# Patient Record
Sex: Female | Born: 1995 | Race: White | Hispanic: No | Marital: Single | Smoking: Never smoker
Health system: Southern US, Community
[De-identification: ages and names within clinical notes are randomized; demographics above are authoritative.]

---

## 2016-08-10 ENCOUNTER — Encounter: Payer: Self-pay | Admitting: Physician Assistant

## 2016-09-24 ENCOUNTER — Ambulatory Visit (INDEPENDENT_AMBULATORY_CARE_PROVIDER_SITE_OTHER): Payer: BLUE CROSS/BLUE SHIELD | Admitting: Physician Assistant

## 2016-09-24 ENCOUNTER — Encounter: Payer: Self-pay | Admitting: Physician Assistant

## 2016-09-24 VITALS — BP 120/77 | HR 84 | Temp 97.2°F | Ht 62.0 in | Wt 137.2 lb

## 2016-09-24 DIAGNOSIS — Z0000 Encounter for general adult medical examination without abnormal findings: Secondary | ICD-10-CM

## 2016-09-24 MED ORDER — DESOGESTREL-ETHINYL ESTRADIOL 0.15-0.02/0.01 MG (21/5) PO TABS: 1.0000 | ORAL_TABLET | Freq: Every day | ORAL | 11 refills | Status: DC

## 2016-09-24 NOTE — Progress Notes (Signed)
BP 120/77   Pulse 84   Temp 97.2 F (36.2 C) (Oral)   Ht 5' 2"  (1.575 m)   Wt 137 lb 3.2 oz (62.2 kg)   LMP 09/17/2016   BMI 25.09 kg/m    Subjective:    Patient ID: Sheryl Baxter, female    DOB: Nov 04, 1995, 21 y.o.   MRN: [id number]  HPI: Sheryl Baxter is a 21 y.o. female presenting on 09/24/2016 for Annual Exam  This patient comes in for annual well physical examination. All medications are reviewed today. There are no reports of any problems with the medications. All of the medical conditions are reviewed and updated.  Lab work is reviewed and will be ordered as medically necessary. There are no new problems reported with today's visit.  Patient reports doing well overall. It is her first female exam and somewhat nervous.  History reviewed. No pertinent past medical history. Relevant past medical, surgical, family and social history reviewed and updated as indicated. Interim medical history since our last visit reviewed. Allergies and medications reviewed and updated. DATA REVIEWED: CHART IN EPIC  Social History   Social History  . Marital status: Single    Spouse name: N/A  . Number of children: N/A  . Years of education: N/A   Occupational History  . Not on file.   Social History Main Topics  . Smoking status: Never Smoker  . Smokeless tobacco: Never Used  . Alcohol use No  . Drug use: No  . Sexual activity: Not Currently   Other Topics Concern  . Not on file   Social History Narrative  . No narrative on file    History reviewed. No pertinent surgical history.  Family History  Problem Relation Age of Onset  . GER disease Mother   . Heart murmur Mother   . GER disease Father   . Sleep apnea Father   . Depression Maternal Grandmother   . Bipolar disorder Maternal Grandmother   . Osteoporosis Maternal Grandmother   . GER disease Maternal Grandmother   . Heart disease Maternal Grandfather   . Cancer Paternal Grandmother     Esophagus cancer  .  Heart disease Paternal Grandfather     Review of Systems  Constitutional: Negative.  Negative for activity change, fatigue and fever.  HENT: Negative.   Eyes: Negative.   Respiratory: Negative.  Negative for cough.   Cardiovascular: Negative.  Negative for chest pain.  Gastrointestinal: Negative.  Negative for abdominal pain.  Endocrine: Negative.   Genitourinary: Negative.  Negative for dysuria.  Musculoskeletal: Negative.   Skin: Negative.   Neurological: Negative.     Allergies as of 09/24/2016      Reactions   Zyrtec [cetirizine]       Medication List       Accurate as of 09/24/16 10:13 AM. Always use your most recent med list.          desogestrel-ethinyl estradiol 0.15-0.02/0.01 MG (21/5) tablet Commonly known as:  KARIVA,AZURETTE,MIRCETTE Take 1 tablet by mouth daily.          Objective:    BP 120/77   Pulse 84   Temp 97.2 F (36.2 C) (Oral)   Ht 5' 2"  (1.575 m)   Wt 137 lb 3.2 oz (62.2 kg)   LMP 09/17/2016   BMI 25.09 kg/m   Allergies  Allergen Reactions  . Zyrtec [Cetirizine]     Wt Readings from Last 3 Encounters:  09/24/16 137 lb 3.2 oz (62.2 kg)  Physical Exam  Constitutional: She is oriented to person, place, and time. She appears well-developed and well-nourished.  HENT:  Head: Normocephalic and atraumatic.  Eyes: Conjunctivae and EOM are normal. Pupils are equal, round, and reactive to light.  Neck: Normal range of motion. Neck supple.  Cardiovascular: Normal rate, regular rhythm, normal heart sounds and intact distal pulses.   Pulmonary/Chest: Effort normal and breath sounds normal. Right breast exhibits no mass, no skin change and no tenderness. Left breast exhibits no mass, no skin change and no tenderness. Breasts are symmetrical.  Abdominal: Soft. Bowel sounds are normal.  Genitourinary: Vagina normal and uterus normal. Rectal exam shows no fissure. No breast swelling, tenderness, discharge or bleeding. There is no tenderness or  lesion on the right labia. There is no tenderness or lesion on the left labia. Uterus is not deviated, not enlarged and not tender. Cervix exhibits no motion tenderness, no discharge and no friability. Right adnexum displays no mass, no tenderness and no fullness. Left adnexum displays no mass, no tenderness and no fullness. No tenderness or bleeding in the vagina. No vaginal discharge found.  Neurological: She is alert and oriented to person, place, and time. She has normal reflexes.  Skin: Skin is warm and dry. No rash noted.  Psychiatric: She has a normal mood and affect. Her behavior is normal. Judgment and thought content normal.    No results found for this or any previous visit.    Assessment & Plan:   1. Well adult exam - desogestrel-ethinyl estradiol (KARIVA,AZURETTE,MIRCETTE) 0.15-0.02/0.01 MG (21/5) tablet; Take 1 tablet by mouth daily.  Dispense: 1 Package; Refill: 11   Continue all other maintenance medications as listed above.  Follow up plan: Return in about 1 year (around 09/24/2017) for recheck.  Educational handout given for health maintenance  Terald Sleeper PA-C [facility] [address]  [place], [place] [address] [phone]   09/24/2016, 10:13 AM

## 2016-09-24 NOTE — Addendum Note (Signed)
Addended by: Thana Ates on: 09/24/2016 10:20 AM   Modules accepted: Orders

## 2016-09-24 NOTE — Patient Instructions (Signed)
Health Maintenance, Female Adopting a healthy lifestyle and getting preventive care can go a long way to promote health and wellness. Talk with your health care provider about what schedule of regular examinations is right for you. This is a good chance for you to check in with your provider about disease prevention and staying healthy. In between checkups, there are plenty of things you can do on your own. Experts have done a lot of research about which lifestyle changes and preventive measures are most likely to keep you healthy. Ask your health care provider for more information. Weight and diet Eat a healthy diet  Be sure to include plenty of vegetables, fruits, low-fat dairy products, and lean protein.  Do not eat a lot of foods high in solid fats, added sugars, or salt.  Get regular exercise. This is one of the most important things you can do for your health.  Most adults should exercise for at least 150 minutes each week. The exercise should increase your heart rate and make you sweat (moderate-intensity exercise).  Most adults should also do strengthening exercises at least twice a week. This is in addition to the moderate-intensity exercise. Maintain a healthy weight  Body mass index (BMI) is a measurement that can be used to identify possible weight problems. It estimates body fat based on height and weight. Your health care provider can help determine your BMI and help you achieve or maintain a healthy weight.  For females 76 years of age and older:  A BMI below 18.5 is considered underweight.  A BMI of 18.5 to 24.9 is normal.  A BMI of 25 to 29.9 is considered overweight.  A BMI of 30 and above is considered obese. Watch levels of cholesterol and blood lipids  You should start having your blood tested for lipids and cholesterol at 21 years of age, then have this test every 5 years.  You may need to have your cholesterol levels checked more often if:  Your lipid or  cholesterol levels are high.  You are older than 21 years of age.  You are at high risk for heart disease. Cancer screening Lung Cancer  Lung cancer screening is recommended for adults 64-42 years old who are at high risk for lung cancer because of a history of smoking.  A yearly low-dose CT scan of the lungs is recommended for people who:  Currently smoke.  Have quit within the past 15 years.  Have at least a 30-pack-year history of smoking. A pack year is smoking an average of one pack of cigarettes a day for 1 year.  Yearly screening should continue until it has been 15 years since you quit.  Yearly screening should stop if you develop a health problem that would prevent you from having lung cancer treatment. Breast Cancer  Practice breast self-awareness. This means understanding how your breasts normally appear and feel.  It also means doing regular breast self-exams. Let your health care provider know about any changes, no matter how small.  If you are in your 20s or 30s, you should have a clinical breast exam (CBE) by a health care provider every 1-3 years as part of a regular health exam.  If you are 34 or older, have a CBE every year. Also consider having a breast X-ray (mammogram) every year.  If you have a family history of breast cancer, talk to your health care provider about genetic screening.  If you are at high risk for breast cancer, talk  to your health care provider about having an MRI and a mammogram every year.  Breast cancer gene (BRCA) assessment is recommended for women who have family members with BRCA-related cancers. BRCA-related cancers include:  Breast.  Ovarian.  Tubal.  Peritoneal cancers.  Results of the assessment will determine the need for genetic counseling and BRCA1 and BRCA2 testing. Cervical Cancer  Your health care provider may recommend that you be screened regularly for cancer of the pelvic organs (ovaries, uterus, and vagina).  This screening involves a pelvic examination, including checking for microscopic changes to the surface of your cervix (Pap test). You may be encouraged to have this screening done every 3 years, beginning at age 24.  For women ages 66-65, health care providers may recommend pelvic exams and Pap testing every 3 years, or they may recommend the Pap and pelvic exam, combined with testing for human papilloma virus (HPV), every 5 years. Some types of HPV increase your risk of cervical cancer. Testing for HPV may also be done on women of any age with unclear Pap test results.  Other health care providers may not recommend any screening for nonpregnant women who are considered low risk for pelvic cancer and who do not have symptoms. Ask your health care provider if a screening pelvic exam is right for you.  If you have had past treatment for cervical cancer or a condition that could lead to cancer, you need Pap tests and screening for cancer for at least 20 years after your treatment. If Pap tests have been discontinued, your risk factors (such as having a new sexual partner) need to be reassessed to determine if screening should resume. Some women have medical problems that increase the chance of getting cervical cancer. In these cases, your health care provider may recommend more frequent screening and Pap tests. Colorectal Cancer  This type of cancer can be detected and often prevented.  Routine colorectal cancer screening usually begins at 21 years of age and continues through 21 years of age.  Your health care provider may recommend screening at an earlier age if you have risk factors for colon cancer.  Your health care provider may also recommend using home test kits to check for hidden blood in the stool.  A small camera at the end of a tube can be used to examine your colon directly (sigmoidoscopy or colonoscopy). This is done to check for the earliest forms of colorectal cancer.  Routine  screening usually begins at age 41.  Direct examination of the colon should be repeated every 5-10 years through 21 years of age. However, you may need to be screened more often if early forms of precancerous polyps or small growths are found. Skin Cancer  Check your skin from head to toe regularly.  Tell your health care provider about any new moles or changes in moles, especially if there is a change in a mole's shape or color.  Also tell your health care provider if you have a mole that is larger than the size of a pencil eraser.  Always use sunscreen. Apply sunscreen liberally and repeatedly throughout the day.  Protect yourself by wearing long sleeves, pants, a wide-brimmed hat, and sunglasses whenever you are outside. Heart disease, diabetes, and high blood pressure  High blood pressure causes heart disease and increases the risk of stroke. High blood pressure is more likely to develop in:  People who have blood pressure in the high end of the normal range (130-139/85-89 mm Hg).  People who are overweight or obese.  People who are African American.  If you are 59-24 years of age, have your blood pressure checked every 3-5 years. If you are 34 years of age or older, have your blood pressure checked every year. You should have your blood pressure measured twice-once when you are at a hospital or clinic, and once when you are not at a hospital or clinic. Record the average of the two measurements. To check your blood pressure when you are not at a hospital or clinic, you can use:  An automated blood pressure machine at a pharmacy.  A home blood pressure monitor.  If you are between 29 years and 60 years old, ask your health care provider if you should take aspirin to prevent strokes.  Have regular diabetes screenings. This involves taking a blood sample to check your fasting blood sugar level.  If you are at a normal weight and have a low risk for diabetes, have this test once  every three years after 21 years of age.  If you are overweight and have a high risk for diabetes, consider being tested at a younger age or more often. Preventing infection Hepatitis B  If you have a higher risk for hepatitis B, you should be screened for this virus. You are considered at high risk for hepatitis B if:  You were born in a country where hepatitis B is common. Ask your health care provider which countries are considered high risk.  Your parents were born in a high-risk country, and you have not been immunized against hepatitis B (hepatitis B vaccine).  You have HIV or AIDS.  You use needles to inject street drugs.  You live with someone who has hepatitis B.  You have had sex with someone who has hepatitis B.  You get hemodialysis treatment.  You take certain medicines for conditions, including cancer, organ transplantation, and autoimmune conditions. Hepatitis C  Blood testing is recommended for:  Everyone born from [birth year] through 1965.  Anyone with known risk factors for hepatitis C. Sexually transmitted infections (STIs)  You should be screened for sexually transmitted infections (STIs) including gonorrhea and chlamydia if:  You are sexually active and are younger than 21 years of age.  You are older than 21 years of age and your health care provider tells you that you are at risk for this type of infection.  Your sexual activity has changed since you were last screened and you are at an increased risk for chlamydia or gonorrhea. Ask your health care provider if you are at risk.  If you do not have HIV, but are at risk, it may be recommended that you take a prescription medicine daily to prevent HIV infection. This is called pre-exposure prophylaxis (PrEP). You are considered at risk if:  You are sexually active and do not regularly use condoms or know the HIV status of your partner(s).  You take drugs by injection.  You are sexually active with a partner  who has HIV. Talk with your health care provider about whether you are at high risk of being infected with HIV. If you choose to begin PrEP, you should first be tested for HIV. You should then be tested every 3 months for as long as you are taking PrEP. Pregnancy  If you are premenopausal and you may become pregnant, ask your health care provider about preconception counseling.  If you may become pregnant, take 400 to 800 micrograms (mcg) of folic acid  every day.  If you want to prevent pregnancy, talk to your health care provider about birth control (contraception). Osteoporosis and menopause  Osteoporosis is a disease in which the bones lose minerals and strength with aging. This can result in serious bone fractures. Your risk for osteoporosis can be identified using a bone density scan.  If you are 4 years of age or older, or if you are at risk for osteoporosis and fractures, ask your health care provider if you should be screened.  Ask your health care provider whether you should take a calcium or vitamin D supplement to lower your risk for osteoporosis.  Menopause may have certain physical symptoms and risks.  Hormone replacement therapy may reduce some of these symptoms and risks. Talk to your health care provider about whether hormone replacement therapy is right for you. Follow these instructions at home:  Schedule regular health, dental, and eye exams.  Stay current with your immunizations.  Do not use any tobacco products including cigarettes, chewing tobacco, or electronic cigarettes.  If you are pregnant, do not drink alcohol.  If you are breastfeeding, limit how much and how often you drink alcohol.  Limit alcohol intake to no more than 1 drink per day for nonpregnant women. One drink equals 12 ounces of beer, 5 ounces of wine, or 1 ounces of hard liquor.  Do not use street drugs.  Do not share needles.  Ask your health care provider for help if you need support  or information about quitting drugs.  Tell your health care provider if you often feel depressed.  Tell your health care provider if you have ever been abused or do not feel safe at home. This information is not intended to replace advice given to you by your health care provider. Make sure you discuss any questions you have with your health care provider. Document Released: 11/23/2010 Document Revised: 10/16/2015 Document Reviewed: 02/11/2015 Elsevier Interactive Patient Education  2017 [employer].

## 2016-09-29 LAB — PAP IG, CT-NG, RFX HPV ASCU
Chlamydia, Nuc. Acid Amp: NEGATIVE
GONOCOCCUS BY NUCLEIC ACID AMP: NEGATIVE
PAP Smear Comment: 0

## 2016-10-06 ENCOUNTER — Ambulatory Visit (INDEPENDENT_AMBULATORY_CARE_PROVIDER_SITE_OTHER): Payer: BLUE CROSS/BLUE SHIELD | Admitting: Family Medicine

## 2016-10-06 ENCOUNTER — Ambulatory Visit (INDEPENDENT_AMBULATORY_CARE_PROVIDER_SITE_OTHER): Payer: BLUE CROSS/BLUE SHIELD

## 2016-10-06 ENCOUNTER — Encounter: Payer: Self-pay | Admitting: Family Medicine

## 2016-10-06 VITALS — BP 112/73 | HR 86 | Temp 97.6°F | Ht 62.0 in | Wt 140.0 lb

## 2016-10-06 DIAGNOSIS — M25561 Pain in right knee: Secondary | ICD-10-CM | POA: Diagnosis not present

## 2016-10-06 MED ORDER — METHYLPREDNISOLONE ACETATE 80 MG/ML IJ SUSP
80.0000 mg | Freq: Once | INTRAMUSCULAR | Status: AC
  Administered 2016-10-06: 80 mg via INTRAMUSCULAR

## 2016-10-06 NOTE — Progress Notes (Signed)
BP 112/73   Pulse 86   Temp 97.6 F (36.4 C) (Oral)   Ht 5' 2"  (1.575 m)   Wt 140 lb (63.5 kg)   LMP 09/17/2016   BMI 25.61 kg/m    Subjective:    Patient ID: Sheryl Baxter, female    DOB: August 14, 1995, 21 y.o.   MRN: [id number]  HPI: Sheryl Baxter is a 21 y.o. female presenting on 10/06/2016 for Knot on right knee and Knee Pain (right knee, painful when bending and if she is on feet a lot)   HPI Right knee pain Patient has been having right knee pain for the past week. She cannot recall any specific incident or trauma that brought it on but she is at her work place where she is up and down ladders and on concrete floors a lot. She started developing the swelling and pain in her right knee and took some Tylenol and thought that it would get better but has not get better over the past weeks that she is coming in here today to get it evaluated. She denies any giving way of the knee but does have some popping. She denies any locking or catching. She denies any fevers or chills or redness or warmth.  Relevant past medical, surgical, family and social history reviewed and updated as indicated. Interim medical history since our last visit reviewed. Allergies and medications reviewed and updated.  Review of Systems  Constitutional: Negative for chills and fever.  Respiratory: Negative for chest tightness and shortness of breath.   Cardiovascular: Negative for chest pain and leg swelling.  Musculoskeletal: Positive for arthralgias and joint swelling. Negative for back pain and gait problem.  Skin: Negative for color change and rash.  Neurological: Negative for light-headedness and headaches.  Psychiatric/Behavioral: Negative for agitation and behavioral problems.  All other systems reviewed and are negative.   Per HPI unless specifically indicated above        Objective:    BP 112/73   Pulse 86   Temp 97.6 F (36.4 C) (Oral)   Ht 5' 2"  (1.575 m)   Wt 140 lb (63.5 kg)    LMP 09/17/2016   BMI 25.61 kg/m   Wt Readings from Last 3 Encounters:  10/06/16 140 lb (63.5 kg)  09/24/16 137 lb 3.2 oz (62.2 kg)    Physical Exam  Constitutional: She is oriented to person, place, and time. She appears well-developed and well-nourished. No distress.  Eyes: Conjunctivae are normal.  Musculoskeletal: Normal range of motion. She exhibits tenderness. She exhibits no edema.       Right knee: She exhibits effusion and abnormal meniscus. She exhibits normal range of motion, no erythema, normal alignment, no LCL laxity, normal patellar mobility, no bony tenderness and no MCL laxity. Tenderness found. Lateral joint line tenderness noted.  Neurological: She is alert and oriented to person, place, and time. Coordination normal.  Skin: Skin is warm and dry. No rash noted. She is not diaphoretic.  Psychiatric: She has a normal mood and affect. Her behavior is normal.  Nursing note and vitals reviewed.  Right knee x-ray: No acute bony abnormalities  Knee injection: Risk factors of bleeding and infection discussed with patient and patient is agreeable towards injection. Patient prepped with Betadine. Lateral approach towards injection used. Injected 80 mg of Depo-Medrol and 1 mL of 2% lidocaine. Patient tolerated procedure well and no side effects from noted. Minimal to no bleeding. Simple bandage applied after.     Assessment & Plan:  Problem List Items Addressed This Visit    None    Visit Diagnoses    Acute pain of right knee    -  Primary   Relevant Medications   methylPREDNISolone acetate (DEPO-MEDROL) injection 80 mg (Start on 10/06/2016  2:45 PM)   Other Relevant Orders   DG Knee 1-2 Views Right (Completed)      Return as needed if worsens or does not improve, likely suspicious for meniscal injury  Follow up plan: Return if symptoms worsen or fail to improve.  Counseling provided for all of the vaccine components Orders Placed This Encounter  Procedures  . DG Knee  1-2 Views Right    Caryl Pina, MD [facility] 10/06/2016, 1:51 PM

## 2017-02-14 ENCOUNTER — Other Ambulatory Visit: Payer: Self-pay | Admitting: Physician Assistant

## 2017-02-14 DIAGNOSIS — Z0000 Encounter for general adult medical examination without abnormal findings: Secondary | ICD-10-CM

## 2017-02-14 MED ORDER — DESOGESTREL-ETHINYL ESTRADIOL 0.15-0.02/0.01 MG (21/5) PO TABS: 1.0000 | ORAL_TABLET | Freq: Every day | ORAL | 3 refills | Status: DC

## 2017-02-14 NOTE — Telephone Encounter (Signed)
Rx sent to [facility]

## 2017-03-30 ENCOUNTER — Ambulatory Visit (INDEPENDENT_AMBULATORY_CARE_PROVIDER_SITE_OTHER): Payer: 59 | Admitting: Physician Assistant

## 2017-03-30 ENCOUNTER — Encounter: Payer: Self-pay | Admitting: Physician Assistant

## 2017-03-30 VITALS — BP 117/72 | HR 79 | Temp 98.4°F | Ht 62.0 in | Wt 142.0 lb

## 2017-03-30 DIAGNOSIS — N390 Urinary tract infection, site not specified: Secondary | ICD-10-CM

## 2017-03-30 DIAGNOSIS — R300 Dysuria: Secondary | ICD-10-CM | POA: Diagnosis not present

## 2017-03-30 DIAGNOSIS — N3000 Acute cystitis without hematuria: Secondary | ICD-10-CM | POA: Diagnosis not present

## 2017-03-30 HISTORY — DX: Urinary tract infection, site not specified: N39.0

## 2017-03-30 LAB — URINALYSIS, COMPLETE
BILIRUBIN UA: NEGATIVE
Glucose, UA: NEGATIVE
KETONES UA: NEGATIVE
LEUKOCYTES UA: NEGATIVE
Nitrite, UA: NEGATIVE
PH UA: 5.5 (ref 5.0–7.5)
Protein, UA: NEGATIVE
SPEC GRAV UA: 1.02 (ref 1.005–1.030)
UUROB: 0.2 mg/dL (ref 0.2–1.0)

## 2017-03-30 LAB — MICROSCOPIC EXAMINATION
EPITHELIAL CELLS (NON RENAL): NONE SEEN /HPF (ref 0–10)
RENAL EPITHEL UA: NONE SEEN /HPF

## 2017-03-30 MED ORDER — SULFAMETHOXAZOLE-TRIMETHOPRIM 400-80 MG PO TABS: 1.0000 | ORAL_TABLET | Freq: Two times a day (BID) | ORAL | 0 refills | Status: DC

## 2017-03-30 NOTE — Progress Notes (Signed)
BP 117/72   Pulse 79   Temp 98.4 F (36.9 C) (Oral)   Ht 5' 2"  (1.575 m)   Wt 142 lb (64.4 kg)   BMI 25.97 kg/m    Subjective:    Patient ID: Sheryl Baxter, female    DOB: [birth year], 21 y.o.   MRN: [id number]  HPI: Sheryl Baxter is a 21 y.o. female presenting on 03/30/2017 for Back Pain and Urinary Frequency  This patient has had several days of dysuria, frequency and nocturia. There is also pain over the bladder in the suprapubic region, no back pain. Denies leakage or hematuria.  Denies fever or chills. No pain in flank area.  Relevant past medical, surgical, family and social history reviewed and updated as indicated. Allergies and medications reviewed and updated.  History reviewed. No pertinent past medical history.  History reviewed. No pertinent surgical history.  Review of Systems  Constitutional: Negative.   HENT: Negative.   Eyes: Negative.   Respiratory: Negative.   Gastrointestinal: Negative.   Genitourinary: Positive for difficulty urinating, dysuria and urgency. Negative for flank pain.    Allergies as of 03/30/2017      Reactions   Zyrtec [cetirizine]       Medication List        Accurate as of 03/30/17  2:44 PM. Always use your most recent med list.          desogestrel-ethinyl estradiol 0.15-0.02/0.01 MG (21/5) tablet Commonly known as:  KARIVA,AZURETTE,MIRCETTE Take 1 tablet by mouth daily.   sulfamethoxazole-trimethoprim 400-80 MG tablet Commonly known as:  BACTRIM,SEPTRA Take 1 tablet 2 (two) times daily by mouth.          Objective:    BP 117/72   Pulse 79   Temp 98.4 F (36.9 C) (Oral)   Ht 5' 2"  (1.575 m)   Wt 142 lb (64.4 kg)   BMI 25.97 kg/m   Allergies  Allergen Reactions  . Zyrtec [Cetirizine]     Physical Exam  Constitutional: She is oriented to person, place, and time. She appears well-developed and well-nourished.  HENT:  Head: Normocephalic and atraumatic.  Eyes: Conjunctivae are normal. Pupils are equal,  round, and reactive to light.  Cardiovascular: Normal rate, regular rhythm, normal heart sounds and intact distal pulses.  Pulmonary/Chest: Effort normal and breath sounds normal.  Abdominal: Soft. Bowel sounds are normal. She exhibits no distension and no mass. There is tenderness in the suprapubic area. There is no rebound, no guarding and no CVA tenderness.  Neurological: She is alert and oriented to person, place, and time. She has normal reflexes.  Skin: Skin is warm and dry. No rash noted.  Psychiatric: She has a normal mood and affect. Her behavior is normal. Judgment and thought content normal.  Nursing note and vitals reviewed.       Assessment & Plan:   1. Dysuria - Urine Culture - Urinalysis, Complete  2. Acute cystitis without hematuria - sulfamethoxazole-trimethoprim (BACTRIM,SEPTRA) 400-80 MG tablet; Take 1 tablet 2 (two) times daily by mouth.  Dispense: 14 tablet; Refill: 0    Current Outpatient Medications:  .  desogestrel-ethinyl estradiol (KARIVA,AZURETTE,MIRCETTE) 0.15-0.02/0.01 MG (21/5) tablet, Take 1 tablet by mouth daily., Disp: 3 Package, Rfl: 3 .  sulfamethoxazole-trimethoprim (BACTRIM,SEPTRA) 400-80 MG tablet, Take 1 tablet 2 (two) times daily by mouth., Disp: 14 tablet, Rfl: 0 Continue all other maintenance medications as listed above.  Follow up plan: Follow-up as needed or worsening of symptoms. Call office for any issues.   Educational  handout given for [place] PA-C [facility] [address]  [place], [place] [address] [phone]   03/30/2017, 2:44 PM

## 2017-03-30 NOTE — Patient Instructions (Signed)
In a few days you may receive a survey in the mail or online from [employer] regarding your visit with us today. Please take a moment to fill this out. Your feedback is very important to our whole office. It can help us better understand your needs as well as improve your experience and satisfaction. Thank you for taking your time to complete it. We care about you.  Kennet Mccort, PA-C  

## 2017-03-31 LAB — URINE CULTURE

## 2017-10-04 ENCOUNTER — Ambulatory Visit (INDEPENDENT_AMBULATORY_CARE_PROVIDER_SITE_OTHER): Payer: 59 | Admitting: Family

## 2017-10-04 ENCOUNTER — Encounter: Payer: Self-pay | Admitting: Family

## 2017-10-04 VITALS — BP 124/75 | HR 80 | Temp 98.8°F | Ht 62.0 in | Wt 144.0 lb

## 2017-10-04 DIAGNOSIS — G44209 Tension-type headache, unspecified, not intractable: Secondary | ICD-10-CM

## 2017-10-04 DIAGNOSIS — Z113 Encounter for screening for infections with a predominantly sexual mode of transmission: Secondary | ICD-10-CM

## 2017-10-04 DIAGNOSIS — J301 Allergic rhinitis due to pollen: Secondary | ICD-10-CM

## 2017-10-04 DIAGNOSIS — R1031 Right lower quadrant pain: Secondary | ICD-10-CM

## 2017-10-04 LAB — URINALYSIS, COMPLETE
BILIRUBIN UA: NEGATIVE
GLUCOSE, UA: NEGATIVE
Ketones, UA: NEGATIVE
Leukocytes, UA: NEGATIVE
Nitrite, UA: NEGATIVE
PROTEIN UA: NEGATIVE
RBC UA: NEGATIVE
Specific Gravity, UA: 1.02 (ref 1.005–1.030)
UUROB: 0.2 mg/dL (ref 0.2–1.0)
pH, UA: 7.5 (ref 5.0–7.5)

## 2017-10-04 LAB — MICROSCOPIC EXAMINATION
Bacteria, UA: NONE SEEN
RBC, UA: NONE SEEN /hpf (ref 0–2)
RENAL EPITHEL UA: NONE SEEN /HPF

## 2017-10-04 MED ORDER — LORATADINE 10 MG PO TABS: 10.0000 mg | ORAL_TABLET | Freq: Every day | ORAL | 11 refills | Status: DC

## 2017-10-04 MED ORDER — FLUTICASONE PROPIONATE 50 MCG/ACT NA SUSP
2.0000 | Freq: Every day | NASAL | 6 refills | Status: DC
Start: 2017-10-04 — End: 2018-06-17

## 2017-10-04 NOTE — Progress Notes (Signed)
Subjective:    Patient ID: Sheryl Baxter, female    DOB: 05-16-1996, 22 y.o.   MRN: [id number]  Chief Complaint  Patient presents with  . Abdominal Pain    for three days with headaches    Abdominal Pain  This is a new problem. The current episode started in the past 7 days. The onset quality is gradual. The problem occurs intermittently. The problem has been unchanged. The pain is located in the generalized abdominal region. The pain is at a severity of 5/10. The pain is mild. The abdominal pain does not radiate. Associated symptoms include headaches and nausea. Pertinent negatives include no belching, constipation, diarrhea, dysuria, fever, hematochezia, hematuria or vomiting. The pain is aggravated by being still. The pain is relieved by movement. She has tried nothing for the symptoms. The treatment provided no relief.   PT requesting STD testing today. States she has had one sexual partner over the last year. Last menstrual cycle was two weeks ago.     Review of Systems  Constitutional: Negative for fever.  Gastrointestinal: Positive for abdominal pain and nausea. Negative for constipation, diarrhea, hematochezia and vomiting.  Genitourinary: Negative for dysuria and hematuria.  Neurological: Positive for headaches.  All other systems reviewed and are negative.      Objective:   Physical Exam  Constitutional: She is oriented to person, place, and time. She appears well-developed and well-nourished. No distress.  HENT:  Head: Normocephalic and atraumatic.  Right Ear: External ear normal.  Mouth/Throat: Oropharynx is clear and moist.  Eyes: Pupils are equal, round, and reactive to light.  Neck: Normal range of motion. Neck supple. No thyromegaly present.  Cardiovascular: Normal rate, regular rhythm, normal heart sounds and intact distal pulses.  No murmur heard. Pulmonary/Chest: Effort normal and breath sounds normal. No respiratory distress. She has no wheezes.    Abdominal: Soft. Bowel sounds are normal. She exhibits no distension. There is tenderness (mild) in the right lower quadrant.  Musculoskeletal: Normal range of motion. She exhibits no edema or tenderness.  Neurological: She is alert and oriented to person, place, and time. She has normal reflexes. No cranial nerve deficit.  Skin: Skin is warm and dry.  Psychiatric: She has a normal mood and affect. Her behavior is normal. Judgment and thought content normal.  Vitals reviewed.    BP 124/75   Pulse 80   Temp 98.8 F (37.1 C) (Oral)   Ht 5' 2"  (1.575 m)   Wt 144 lb (65.3 kg)   BMI 26.34 kg/m      Assessment & Plan:  Sheryl Baxter comes in today with chief complaint of Abdominal Pain (for three days with headaches)   Diagnosis and orders addressed:  1. Right lower quadrant abdominal pain -If pain does not improve call office. - Urinalysis, Complete - CBC with Differential/Platelet - Chlamydia/Gonococcus/Trichomonas, NAA - STD Screen (8) - Beta hCG quant (ref lab)  2. Acute non intractable tension-type headache I believe this is related to sinus issues, start claritin and flonase - loratadine (CLARITIN) 10 MG tablet; Take 1 tablet (10 mg total) by mouth daily.  Dispense: 30 tablet; Refill: 11 - fluticasone (FLONASE) 50 MCG/ACT nasal spray; Place 2 sprays into both nostrils daily.  Dispense: 16 g; Refill: 6  3. Screening for STDs (sexually transmitted diseases) - Chlamydia/Gonococcus/Trichomonas, NAA - STD Screen (8)  4. Allergic rhinitis due to pollen, unspecified seasonality - loratadine (CLARITIN) 10 MG tablet; Take 1 tablet (10 mg total) by mouth daily.  Dispense:  30 tablet; Refill: 11 - fluticasone (FLONASE) 50 MCG/ACT nasal spray; Place 2 sprays into both nostrils daily.  Dispense: 16 g; Refill: [place], FNP

## 2017-10-04 NOTE — Patient Instructions (Signed)

## 2017-10-05 LAB — CHLAMYDIA/GONOCOCCUS/TRICHOMONAS, NAA
CHLAMYDIA BY NAA: NEGATIVE
Gonococcus by NAA: NEGATIVE
Trich vag by NAA: NEGATIVE

## 2017-10-05 LAB — STD SCREEN (8)
HEP A IGM: NEGATIVE
HEP B S AG: NEGATIVE
HIV Screen 4th Generation wRfx: NONREACTIVE
HSV 1 Glycoprotein G Ab, IgG: <0.91 index (ref 0.00–0.90)
HSV 2 IgG, Type Spec: <0.91 index (ref 0.00–0.90)
Hep B C IgM: NEGATIVE
Hep C Virus Ab: <0.1 s/co ratio (ref 0.0–0.9)
RPR: NONREACTIVE

## 2017-10-05 LAB — CBC WITH DIFFERENTIAL/PLATELET
BASOS ABS: 0 10*3/uL (ref 0.0–0.2)
BASOS: 0 %
EOS (ABSOLUTE): 0.1 10*3/uL (ref 0.0–0.4)
Eos: 1 %
HEMOGLOBIN: 14.3 g/dL (ref 11.1–15.9)
Hematocrit: 42.6 % (ref 34.0–46.6)
IMMATURE GRANS (ABS): 0 10*3/uL (ref 0.0–0.1)
Immature Granulocytes: 0 %
LYMPHS: 32 %
Lymphocytes Absolute: 2.4 10*3/uL (ref 0.7–3.1)
MCH: 29.1 pg (ref 26.6–33.0)
MCHC: 33.6 g/dL (ref 31.5–35.7)
MCV: 87 fL (ref 79–97)
MONOCYTES: 8 %
Monocytes Absolute: 0.6 10*3/uL (ref 0.1–0.9)
Neutrophils Absolute: 4.4 10*3/uL (ref 1.4–7.0)
Neutrophils: 59 %
Platelets: 281 10*3/uL (ref 150–379)
RBC: 4.92 x10E6/uL (ref 3.77–5.28)
RDW: 13.5 % (ref 12.3–15.4)
WBC: 7.4 10*3/uL (ref 3.4–10.8)

## 2017-10-05 LAB — BETA HCG QUANT (REF LAB): hCG Quant: <1 m[IU]/mL

## 2017-11-30 ENCOUNTER — Encounter: Payer: Self-pay | Admitting: Physician Assistant

## 2017-11-30 ENCOUNTER — Ambulatory Visit (INDEPENDENT_AMBULATORY_CARE_PROVIDER_SITE_OTHER): Payer: Worker's Compensation | Admitting: Physician Assistant

## 2017-11-30 VITALS — BP 113/69 | HR 72 | Temp 97.1°F | Ht 62.0 in | Wt 150.8 lb

## 2017-11-30 DIAGNOSIS — S39012A Strain of muscle, fascia and tendon of lower back, initial encounter: Secondary | ICD-10-CM | POA: Diagnosis not present

## 2017-11-30 DIAGNOSIS — S39012D Strain of muscle, fascia and tendon of lower back, subsequent encounter: Secondary | ICD-10-CM

## 2017-11-30 MED ORDER — PREDNISONE 10 MG (21) PO TBPK: ORAL_TABLET | ORAL | 0 refills | Status: DC

## 2017-11-30 MED ORDER — CYCLOBENZAPRINE HCL 10 MG PO TABS: 10.0000 mg | ORAL_TABLET | Freq: Three times a day (TID) | ORAL | 0 refills | Status: DC | PRN

## 2017-11-30 NOTE — Patient Instructions (Signed)

## 2017-12-02 ENCOUNTER — Other Ambulatory Visit: Payer: Self-pay | Admitting: Physician Assistant

## 2017-12-02 ENCOUNTER — Telehealth: Payer: Self-pay | Admitting: Physician Assistant

## 2017-12-02 MED ORDER — DICLOFENAC POTASSIUM 50 MG PO TABS: 50.0000 mg | ORAL_TABLET | Freq: Two times a day (BID) | ORAL | 0 refills | Status: DC

## 2017-12-02 NOTE — Progress Notes (Signed)
BP 113/69   Pulse 72   Temp (!) 97.1 F (36.2 C) (Oral)   Ht 5' 2"  (1.575 m)   Wt 150 lb 12.8 oz (68.4 kg)   BMI 27.58 kg/m    Subjective:    Patient ID: Sheryl Baxter, female    DOB: [birth year], 22 y.o.   MRN: [id number]  HPI: Sheryl Baxter is a 22 y.o. female presenting on 11/30/2017 for Back Pain (x 2.5 weeks )   This patient comes in for back pain is been going on for 2 and half weeks.  It is in the lower lumbar area along the middle.  She has no radiation of pain to the gluteal area or down her legs.  After she has been lifting things at work it is much worse.  She works as a Scientist, water quality at [employer].  She has never had an injury to her back.  She states that it is worse it is a 5 out of 10.  She states her urine and bowel movements are normal.  She denies any fever. History reviewed. No pertinent past medical history. Relevant past medical, surgical, family and social history reviewed and updated as indicated. Interim medical history since our last visit reviewed. Allergies and medications reviewed and updated. DATA REVIEWED: CHART IN EPIC  Family History reviewed for pertinent findings.  Review of Systems  Constitutional: Negative.  Negative for activity change, fatigue and fever.  HENT: Negative.   Eyes: Negative.   Respiratory: Negative.  Negative for cough.   Cardiovascular: Negative.  Negative for chest pain.  Gastrointestinal: Negative.  Negative for abdominal pain.  Endocrine: Negative.   Genitourinary: Negative.  Negative for dysuria.  Musculoskeletal: Positive for back pain and myalgias.  Skin: Negative.   Neurological: Negative.     Allergies as of 11/30/2017      Reactions   Zyrtec [cetirizine]       Medication List        Accurate as of 11/30/17 11:59 PM. Always use your most recent med list.          cyclobenzaprine 10 MG tablet Commonly known as:  FLEXERIL Take 1 tablet (10 mg total) by mouth 3 (three) times daily as needed for muscle  spasms.   desogestrel-ethinyl estradiol 0.15-0.02/0.01 MG (21/5) tablet Commonly known as:  KARIVA,AZURETTE,MIRCETTE Take 1 tablet by mouth daily.   fluticasone 50 MCG/ACT nasal spray Commonly known as:  FLONASE Place 2 sprays into both nostrils daily.   loratadine 10 MG tablet Commonly known as:  CLARITIN Take 1 tablet (10 mg total) by mouth daily.   predniSONE 10 MG (21) Tbpk tablet Commonly known as:  STERAPRED UNI-PAK 21 TAB As directed x 6 days          Objective:    BP 113/69   Pulse 72   Temp (!) 97.1 F (36.2 C) (Oral)   Ht 5' 2"  (1.575 m)   Wt 150 lb 12.8 oz (68.4 kg)   BMI 27.58 kg/m   Allergies  Allergen Reactions  . Zyrtec [Cetirizine]     Wt Readings from Last 3 Encounters:  11/30/17 150 lb 12.8 oz (68.4 kg)  10/04/17 144 lb (65.3 kg)  03/30/17 142 lb (64.4 kg)    Physical Exam  Constitutional: She is oriented to person, place, and time. She appears well-developed and well-nourished.  HENT:  Head: Normocephalic and atraumatic.  Eyes: Pupils are equal, round, and reactive to light. Conjunctivae and EOM are normal.  Cardiovascular: Normal  rate, regular rhythm, normal heart sounds and intact distal pulses.  Pulmonary/Chest: Effort normal and breath sounds normal.  Abdominal: Soft. Bowel sounds are normal.  Musculoskeletal:       Lumbar back: She exhibits tenderness, pain and spasm.       Back:  Neurological: She is alert and oriented to person, place, and time. She has normal reflexes.  Skin: Skin is warm and dry. No rash noted.  Psychiatric: She has a normal mood and affect. Her behavior is normal. Judgment and thought content normal.        Assessment & Plan:   1. Lumbar strain, subsequent encounter Flexeril 10 mg TID for spasms No lifting over 20 pounds for 1 week Prednisone taper pack  Continue all other maintenance medications as listed above.  Follow up plan: Return in about 1 month (around 12/28/2017), or recheck.  Educational  handout given for [place] PA-C [facility] [address]  [place], [place] [address] [phone]   12/02/2017, 12:56 PM

## 2017-12-02 NOTE — Telephone Encounter (Signed)
Patient states that she is has had nausea and vomiting from the prednisone. Please advise

## 2017-12-02 NOTE — Telephone Encounter (Signed)
Stop medication, I will send another med to her pharmacy

## 2017-12-02 NOTE — Telephone Encounter (Signed)
Patient aware of recommendation.  

## 2017-12-07 ENCOUNTER — Encounter: Payer: Self-pay | Admitting: Physician Assistant

## 2017-12-07 ENCOUNTER — Ambulatory Visit (INDEPENDENT_AMBULATORY_CARE_PROVIDER_SITE_OTHER): Payer: Worker's Compensation | Admitting: Physician Assistant

## 2017-12-07 VITALS — BP 120/74 | HR 71 | Ht 62.0 in | Wt 149.2 lb

## 2017-12-07 DIAGNOSIS — S39012D Strain of muscle, fascia and tendon of lower back, subsequent encounter: Secondary | ICD-10-CM | POA: Diagnosis not present

## 2017-12-07 NOTE — Progress Notes (Signed)
BP 120/74   Pulse 71   Ht 5' 2"  (1.575 m)   Wt 149 lb 3.2 oz (67.7 kg)   BMI 27.29 kg/m    Subjective:    Patient ID: Sheryl Baxter, female    DOB: Feb 29, 1996, 22 y.o.   MRN: [id number]  HPI: Sheryl Baxter is a 22 y.o. female presenting on 12/07/2017 for worker comp (follow up-back )  This patient comes in for recheck on her Workmen's Comp. of her lumbar strain.  She has been limiting her lifting to 20 pounds or less since she was seen last.  She is using Cataflam and Flexeril for the inflammation and spasm.  This past week was the final day of the restriction and she still is having some difficulty.  She tried to lift something a little bit heavier and still felt the pain.  She is primarily sore in the upper lumbar area.  The lower lumbar area is greatly improved she reports.  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 She was given stretches to do for a back strain and she is continuing to do those daily.  I do not feel that she is going to need physical therapy at this time.  If she worsens then we may need to send her.  When I saw her on November 30, 2017 she had had the pain going on for about 2-1/2 weeks.  This would have been approximately June 23 or so.  History reviewed. No pertinent past medical history. Relevant past medical, surgical, family and social history reviewed and updated as indicated. Interim medical history since our last visit reviewed. Allergies and medications reviewed and updated. DATA REVIEWED: CHART IN EPIC  Family History reviewed for pertinent findings.  Review of Systems  Constitutional: Negative.  Negative for activity change, fatigue and fever.  HENT: Negative.   Eyes: Negative.   Respiratory: Negative.  Negative for cough.   Cardiovascular: Negative.  Negative for chest pain.  Gastrointestinal: Negative.  Negative for abdominal pain.  Endocrine: Negative.   Genitourinary: Negative.  Negative for dysuria.    Musculoskeletal: Positive for arthralgias, back pain and myalgias.  Skin: Negative.   Neurological: Negative.     Allergies as of 12/07/2017      Reactions   Zyrtec [cetirizine]       Medication List        Accurate as of 12/07/17 12:50 PM. Always use your most recent med list.          cyclobenzaprine 10 MG tablet Commonly known as:  FLEXERIL Take 1 tablet (10 mg total) by mouth 3 (three) times daily as needed for muscle spasms.   desogestrel-ethinyl estradiol 0.15-0.02/0.01 MG (21/5) tablet Commonly known as:  KARIVA,AZURETTE,MIRCETTE Take 1 tablet by mouth daily.   diclofenac 50 MG tablet Commonly known as:  CATAFLAM Take 1 tablet (50 mg total) by mouth 2 (two) times daily.   fluticasone 50 MCG/ACT nasal spray Commonly known as:  FLONASE Place 2 sprays into both nostrils daily.   loratadine 10 MG tablet Commonly known as:  CLARITIN Take 1 tablet (10 mg total) by mouth daily.          Objective:    BP 120/74   Pulse 71   Ht 5' 2"  (1.575 m)   Wt 149 lb 3.2 oz (67.7 kg)   BMI 27.29 kg/m   Allergies  Allergen Reactions  . Zyrtec [Cetirizine]     Wt Readings  from Last 3 Encounters:  12/07/17 149 lb 3.2 oz (67.7 kg)  11/30/17 150 lb 12.8 oz (68.4 kg)  10/04/17 144 lb (65.3 kg)    Physical Exam  Constitutional: She is oriented to person, place, and time. She appears well-developed and well-nourished.  HENT:  Head: Normocephalic and atraumatic.  Eyes: Pupils are equal, round, and reactive to light. Conjunctivae and EOM are normal.  Cardiovascular: Normal rate, regular rhythm, normal heart sounds and intact distal pulses.  Pulmonary/Chest: Effort normal and breath sounds normal.  Abdominal: Soft. Bowel sounds are normal.  Musculoskeletal:       Lumbar back: She exhibits tenderness, pain and spasm.       Back:  Neurological: She is alert and oriented to person, place, and time. She has normal reflexes.  Skin: Skin is warm and dry. No rash noted.   Psychiatric: She has a normal mood and affect. Her behavior is normal. Judgment and thought content normal.        Assessment & Plan:   1. Lumbar strain, subsequent encounter Continue restricted lifting through December 16, 2017. Return to clinic July 25 Continue Cataflam Continue Flexeril Continue stretches   Continue all other maintenance medications as listed above.  Follow up plan: Return in about 10 days  Educational handout given for [place] PA-C [facility] [address]  [place], [place] [address] [phone]   12/07/2017, 12:50 PM

## 2017-12-15 ENCOUNTER — Encounter: Payer: Self-pay | Admitting: Physician Assistant

## 2017-12-15 ENCOUNTER — Ambulatory Visit (INDEPENDENT_AMBULATORY_CARE_PROVIDER_SITE_OTHER): Payer: Worker's Compensation | Admitting: Physician Assistant

## 2017-12-15 VITALS — BP 120/74 | HR 81 | Temp 98.0°F | Ht 62.0 in | Wt 149.8 lb

## 2017-12-15 DIAGNOSIS — S39012D Strain of muscle, fascia and tendon of lower back, subsequent encounter: Secondary | ICD-10-CM

## 2017-12-15 NOTE — Progress Notes (Signed)
BP 120/74   Pulse 81   Temp 98 F (36.7 C) (Oral)   Ht 5' 2"  (1.575 m)   Wt 149 lb 12.8 oz (67.9 kg)   BMI 27.40 kg/m    Subjective:    Patient ID: Sheryl Baxter, female    DOB: 02/29/1996, 22 y.o.   MRN: [id number]  HPI: Sheryl Baxter is a 22 y.o. female presenting on 12/15/2017 for Back Pain (worker comp follow up )  Back for one week recheck on lumbar strain This was [employer] comp through [employer]. She has been working but lifting 20 pounds or less. There has been improvement this week, but did get up this morning and was a little tight.  History reviewed. No pertinent past medical history. Relevant past medical, surgical, family and social history reviewed and updated as indicated. Interim medical history since our last visit reviewed. Allergies and medications reviewed and updated. DATA REVIEWED: CHART IN EPIC  Family History reviewed for pertinent findings.  Review of Systems  Constitutional: Negative.   HENT: Negative.   Eyes: Negative.   Respiratory: Negative.   Gastrointestinal: Negative.   Genitourinary: Negative.   Musculoskeletal: Positive for back pain and myalgias.    Allergies as of 12/15/2017      Reactions   Zyrtec [cetirizine]       Medication List        Accurate as of 12/15/17 11:29 AM. Always use your most recent med list.          cyclobenzaprine 10 MG tablet Commonly known as:  FLEXERIL Take 1 tablet (10 mg total) by mouth 3 (three) times daily as needed for muscle spasms.   desogestrel-ethinyl estradiol 0.15-0.02/0.01 MG (21/5) tablet Commonly known as:  KARIVA,AZURETTE,MIRCETTE Take 1 tablet by mouth daily.   diclofenac 50 MG tablet Commonly known as:  CATAFLAM Take 1 tablet (50 mg total) by mouth 2 (two) times daily.   fluticasone 50 MCG/ACT nasal spray Commonly known as:  FLONASE Place 2 sprays into both nostrils daily.   loratadine 10 MG tablet Commonly known as:  CLARITIN Take 1 tablet (10 mg total) by mouth  daily.          Objective:    BP 120/74   Pulse 81   Temp 98 F (36.7 C) (Oral)   Ht 5' 2"  (1.575 m)   Wt 149 lb 12.8 oz (67.9 kg)   BMI 27.40 kg/m   Allergies  Allergen Reactions  . Zyrtec [Cetirizine]     Wt Readings from Last 3 Encounters:  12/15/17 149 lb 12.8 oz (67.9 kg)  12/07/17 149 lb 3.2 oz (67.7 kg)  11/30/17 150 lb 12.8 oz (68.4 kg)    Physical Exam  Constitutional: She is oriented to person, place, and time. She appears well-developed and well-nourished.  HENT:  Head: Normocephalic and atraumatic.  Eyes: Pupils are equal, round, and reactive to light. Conjunctivae and EOM are normal.  Cardiovascular: Normal rate, regular rhythm, normal heart sounds and intact distal pulses.  Pulmonary/Chest: Effort normal and breath sounds normal.  Abdominal: Soft. Bowel sounds are normal.  Musculoskeletal:       Lumbar back: Normal. She exhibits normal range of motion, no tenderness, no pain and no spasm.  Neurological: She is alert and oriented to person, place, and time. She has normal reflexes.  Skin: Skin is warm and dry. No rash noted.  Psychiatric: She has a normal mood and affect. Her behavior is normal. Judgment and thought content normal.  Assessment & Plan:   1. Lumbar strain, subsequent encounter Continue stretches meds as needed May return to full duty 12/26/17 ( after returning form vacation).   Continue all other maintenance medications as listed above.  Follow up plan: No follow-ups on file.  Educational handout given for [place] PA-C [facility] [address]  [place], [place] [address] [phone]   12/15/2017, 11:29 AM

## 2017-12-16 ENCOUNTER — Other Ambulatory Visit: Payer: Self-pay | Admitting: Physician Assistant

## 2017-12-16 ENCOUNTER — Telehealth: Payer: Self-pay | Admitting: Physician Assistant

## 2017-12-16 DIAGNOSIS — Z0000 Encounter for general adult medical examination without abnormal findings: Secondary | ICD-10-CM

## 2017-12-16 NOTE — Telephone Encounter (Signed)
Last PAP I see 09/24/16

## 2017-12-20 NOTE — Telephone Encounter (Signed)
Per [facility] med records mailed today

## 2018-01-02 ENCOUNTER — Ambulatory Visit: Payer: 59 | Admitting: Family

## 2018-01-02 ENCOUNTER — Encounter: Payer: Self-pay | Admitting: Family Medicine

## 2018-01-02 ENCOUNTER — Ambulatory Visit (INDEPENDENT_AMBULATORY_CARE_PROVIDER_SITE_OTHER): Payer: 59 | Admitting: Family Medicine

## 2018-01-02 VITALS — BP 121/75 | HR 77 | Temp 97.2°F | Ht 62.0 in | Wt 152.0 lb

## 2018-01-02 DIAGNOSIS — H66001 Acute suppurative otitis media without spontaneous rupture of ear drum, right ear: Secondary | ICD-10-CM | POA: Diagnosis not present

## 2018-01-02 MED ORDER — AMOXICILLIN-POT CLAVULANATE 875-125 MG PO TABS: 1.0000 | ORAL_TABLET | Freq: Two times a day (BID) | ORAL | 0 refills | Status: DC

## 2018-01-02 NOTE — Progress Notes (Signed)
Chief Complaint  Patient presents with  . Ear Pain    pt here today c/o right ear pain    HPI  Patient presents today for denied in today for pain in the right ear.  She was at the beach last week and swam a lot of the water she used swimmer's eardrops but unfortunately after getting home the pain that she recently was experiencing has returned.  This has not impacted her hearing.  She has no other upper respiratory symptoms that she is reporting.  No fever chills sweats  PMH: Smoking status noted ROS: Per HPI  Objective: BP 121/75   Pulse 77   Temp (!) 97.2 F (36.2 C) (Oral)   Ht 5' 2"  (1.575 m)   Wt 152 lb (68.9 kg)   BMI 27.80 kg/m  Gen: NAD, alert, cooperative with exam HEENT: NCAT, EOMI, PERRL the right TM has hyperemia and erythema.  There is some mattering in front of the eardrum as well that appears to be some wax mixed with dried purulent matter. CV: RRR, good S1/S2, no murmur Resp: CTABL, no wheezes, non-labored Ext: No edema, warm Neuro: Alert and oriented, No gross deficits  Assessment and plan:  1. Acute suppurative otitis media of right ear without spontaneous rupture of tympanic membrane, recurrence not specified     Meds ordered this encounter  Medications  . amoxicillin-clavulanate (AUGMENTIN) 875-125 MG tablet    Sig: Take 1 tablet by mouth 2 (two) times daily.    Dispense:  20 tablet    Refill:  0      Follow up as needed.  Claretta Fraise, MD

## 2018-01-12 ENCOUNTER — Ambulatory Visit: Payer: Self-pay | Admitting: Pediatrics

## 2018-01-20 ENCOUNTER — Other Ambulatory Visit (HOSPITAL_COMMUNITY)
Admission: RE | Admit: 2018-01-20 | Discharge: 2018-01-20 | Disposition: A | Discharge status: Home / Self Care | Payer: 59 | Source: Ambulatory Visit | Attending: Family Medicine | Admitting: Family Medicine

## 2018-01-20 ENCOUNTER — Encounter: Payer: Self-pay | Admitting: *Deleted

## 2018-01-20 ENCOUNTER — Ambulatory Visit (INDEPENDENT_AMBULATORY_CARE_PROVIDER_SITE_OTHER): Payer: 59 | Admitting: Family Medicine

## 2018-01-20 ENCOUNTER — Other Ambulatory Visit: Payer: Self-pay | Admitting: Family Medicine

## 2018-01-20 ENCOUNTER — Ambulatory Visit (INDEPENDENT_AMBULATORY_CARE_PROVIDER_SITE_OTHER): Payer: 59

## 2018-01-20 ENCOUNTER — Ambulatory Visit (HOSPITAL_COMMUNITY)
Admission: RE | Admit: 2018-01-20 | Discharge: 2018-01-20 | Disposition: A | Discharge status: Home / Self Care | Payer: 59 | Source: Ambulatory Visit | Attending: Family Medicine | Admitting: Family Medicine

## 2018-01-20 ENCOUNTER — Encounter: Payer: Self-pay | Admitting: Family Medicine

## 2018-01-20 VITALS — BP 120/76 | HR 103 | Temp 98.7°F | Ht 62.0 in | Wt 144.0 lb

## 2018-01-20 DIAGNOSIS — R1084 Generalized abdominal pain: Secondary | ICD-10-CM

## 2018-01-20 DIAGNOSIS — K529 Noninfective gastroenteritis and colitis, unspecified: Secondary | ICD-10-CM

## 2018-01-20 DIAGNOSIS — N200 Calculus of kidney: Secondary | ICD-10-CM | POA: Diagnosis not present

## 2018-01-20 LAB — URINALYSIS, COMPLETE
BILIRUBIN UA: NEGATIVE
GLUCOSE, UA: NEGATIVE
Leukocytes, UA: NEGATIVE
Nitrite, UA: NEGATIVE
UUROB: 0.2 mg/dL (ref 0.2–1.0)
pH, UA: 5.5 (ref 5.0–7.5)

## 2018-01-20 LAB — PREGNANCY, URINE: PREG TEST UR: NEGATIVE

## 2018-01-20 LAB — MICROSCOPIC EXAMINATION: Renal Epithel, UA: NONE SEEN /hpf

## 2018-01-20 MED ORDER — IOPAMIDOL (ISOVUE-300) INJECTION 61%
100.0000 mL | Freq: Once | INTRAVENOUS | Status: AC | PRN
  Administered 2018-01-20: 100 mL via INTRAVENOUS

## 2018-01-20 MED ORDER — DIPHENOXYLATE-ATROPINE 2.5-0.025 MG PO TABS: ORAL_TABLET | ORAL | 0 refills | Status: DC

## 2018-01-20 MED ORDER — ONDANSETRON HCL 8 MG PO TABS: 8.0000 mg | ORAL_TABLET | Freq: Three times a day (TID) | ORAL | 0 refills | Status: DC | PRN

## 2018-01-20 MED ORDER — IOPAMIDOL (ISOVUE-300) INJECTION 61%
30.0000 mL | Freq: Once | INTRAVENOUS | Status: AC | PRN
  Administered 2018-01-20: 30 mL via ORAL

## 2018-01-20 NOTE — Progress Notes (Signed)
Subjective:  Patient ID: Vernona Peake, female    DOB: 23-Mar-1996  Age: 22 y.o. MRN: [id number]  CC: Nausea (x 3 days ); Emesis; and Diarrhea   HPI Tayen Narang presents for abdominal pain accompanied by nausea vomiting and diarrhea for the last 3 days.  This is actually the fourth day she did not sleep well last night.  Yesterday she thought she was getting better but last night had nausea and this morning she vomited when just trying to eat a couple of saltines.  She has had up to 10 loose bowel movements per day all of them small for the last 3 to 4 days as well.  Subjective fever occurred last night.  She describes the pain is going in a band across the mid abdomen and worst at the umbilicus.  The pain is moderate in severity.  It is not accompanied by any rectal bleeding.  It is described as an ache not a cramp.  Depression screen [facility] 2/9 01/20/2018 11/30/2017 10/04/2017  Decreased Interest 0 0 0  Down, Depressed, Hopeless 0 0 0  PHQ - 2 Score 0 0 0    History Ammara has no past medical history on file.   She has no past surgical history on file.   Her family history includes Bipolar disorder in her maternal grandmother; Cancer in her paternal grandmother; Depression in her maternal grandmother; GER disease in her father, maternal grandmother, and mother; Heart disease in her maternal grandfather and paternal grandfather; Heart murmur in her mother; Osteoporosis in her maternal grandmother; Sleep apnea in her father.She reports that she has never smoked. She has never used smokeless tobacco. She reports that she does not drink alcohol or use drugs.    ROS Review of Systems  Constitutional: Positive for appetite change, chills, diaphoresis, fatigue and fever.  HENT: Negative.   Eyes: Negative for visual disturbance.  Respiratory: Negative for shortness of breath.   Cardiovascular: Negative for chest pain.  Gastrointestinal: Positive for abdominal pain, diarrhea, nausea and  vomiting. Negative for anal bleeding, blood in stool and constipation.  Musculoskeletal: Negative for arthralgias.    Objective:  BP 120/76   Pulse (!) 103   Temp 98.7 F (37.1 C) (Oral)   Ht 5' 2"  (1.575 m)   Wt 144 lb (65.3 kg)   LMP 01/06/2018   BMI 26.34 kg/m   BP Readings from Last 3 Encounters:  01/20/18 120/76  01/02/18 121/75  12/15/17 120/74    Wt Readings from Last 3 Encounters:  01/20/18 144 lb (65.3 kg)  01/02/18 152 lb (68.9 kg)  12/15/17 149 lb 12.8 oz (67.9 kg)     Physical Exam  Constitutional: She is oriented to person, place, and time. She appears well-developed and well-nourished.  HENT:  Head: Normocephalic and atraumatic.  Cardiovascular: Normal rate and regular rhythm.  No murmur heard. Pulmonary/Chest: Effort normal and breath sounds normal.  Abdominal: Soft. Bowel sounds are normal. She exhibits no mass. There is tenderness (Left upper quadrant to periumbilical also mildly at the right upper quadrant near the gallbladder fossa without positive Murphy sign). There is no rebound and no guarding.  Neurological: She is alert and oriented to person, place, and time.  Skin: Skin is warm and dry.  Psychiatric: She has a normal mood and affect. Her behavior is normal.      Assessment & Plan:   Shamera was seen today for nausea, emesis and diarrhea.  Diagnoses and all orders for this visit:  Diffuse abdominal  pain -     CBC with Differential/Platelet -     CMP14+EGFR -     Urine Culture -     Urinalysis, Complete -     DG Abd 2 Views; Future -     Amylase -     Lipase -     diphenoxylate-atropine (LOMOTIL) 2.5-0.025 MG tablet; Take 1-2 tabs Q 2 hours PRN for nausea. -     ondansetron (ZOFRAN) 8 MG tablet; Take 1 tablet (8 mg total) by mouth every 8 (eight) hours as needed for nausea or vomiting. -     Microscopic Examination -     Pregnancy, urine; Future  Colitis, acute -     diphenoxylate-atropine (LOMOTIL) 2.5-0.025 MG tablet; Take 1-2  tabs Q 2 hours PRN for nausea. -     ciprofloxacin (CIPRO) 500 MG tablet; Take 1 tablet (500 mg total) by mouth 2 (two) times daily. -     metroNIDAZOLE (FLAGYL) 500 MG tablet; Take 1 tablet (500 mg total) by mouth 3 (three) times daily.  CT report shows colitis. Sodium 128 ORal sodium replacement with Gatorade recommended     I have discontinued Acasia Entwistle's diclofenac and amoxicillin-clavulanate. I am also having her start on diphenoxylate-atropine, ondansetron, ciprofloxacin, and metroNIDAZOLE. Additionally, I am having her maintain her loratadine, fluticasone, cyclobenzaprine, and VIORELE.  Allergies as of 01/20/2018      Reactions   Zyrtec [cetirizine]       Medication List        Accurate as of 01/20/18 11:59 PM. Always use your most recent med list.          ciprofloxacin 500 MG tablet Commonly known as:  CIPRO Take 1 tablet (500 mg total) by mouth 2 (two) times daily.   cyclobenzaprine 10 MG tablet Commonly known as:  FLEXERIL Take 1 tablet (10 mg total) by mouth 3 (three) times daily as needed for muscle spasms.   diphenoxylate-atropine 2.5-0.025 MG tablet Commonly known as:  LOMOTIL Take 1-2 tabs Q 2 hours PRN for nausea.   fluticasone 50 MCG/ACT nasal spray Commonly known as:  FLONASE Place 2 sprays into both nostrils daily.   loratadine 10 MG tablet Commonly known as:  CLARITIN Take 1 tablet (10 mg total) by mouth daily.   metroNIDAZOLE 500 MG tablet Commonly known as:  FLAGYL Take 1 tablet (500 mg total) by mouth 3 (three) times daily.   ondansetron 8 MG tablet Commonly known as:  ZOFRAN Take 1 tablet (8 mg total) by mouth every 8 (eight) hours as needed for nausea or vomiting.   VIORELE 0.15-0.02/0.01 MG (21/5) tablet Generic drug:  desogestrel-ethinyl estradiol TAKE 1 TABLET DAILY        Follow-up: Return if symptoms worsen or fail to improve.  Claretta Fraise, M.D.

## 2018-01-21 LAB — CBC WITH DIFFERENTIAL/PLATELET
Basophils Absolute: 0 10*3/uL (ref 0.0–0.2)
Basos: 0 %
EOS (ABSOLUTE): 0 10*3/uL (ref 0.0–0.4)
EOS: 0 %
HEMATOCRIT: 43 % (ref 34.0–46.6)
HEMOGLOBIN: 14.1 g/dL (ref 11.1–15.9)
Immature Grans (Abs): 0 10*3/uL (ref 0.0–0.1)
Immature Granulocytes: 0 %
Lymphocytes Absolute: 1.1 10*3/uL (ref 0.7–3.1)
Lymphs: 10 %
MCH: 28.1 pg (ref 26.6–33.0)
MCHC: 32.8 g/dL (ref 31.5–35.7)
MCV: 86 fL (ref 79–97)
MONOCYTES: 9 %
MONOS ABS: 1 10*3/uL — AB (ref 0.1–0.9)
NEUTROS ABS: 8.5 10*3/uL — AB (ref 1.4–7.0)
Neutrophils: 81 %
Platelets: 271 10*3/uL (ref 150–450)
RBC: 5.02 x10E6/uL (ref 3.77–5.28)
RDW: 12.7 % (ref 12.3–15.4)
WBC: 10.7 10*3/uL (ref 3.4–10.8)

## 2018-01-21 LAB — CMP14+EGFR
A/G RATIO: 1.5 (ref 1.2–2.2)
ALBUMIN: 4.2 g/dL (ref 3.5–5.5)
ALK PHOS: 61 IU/L (ref 39–117)
ALT: 12 IU/L (ref 0–32)
AST: 15 IU/L (ref 0–40)
BILIRUBIN TOTAL: 0.4 mg/dL (ref 0.0–1.2)
BUN/Creatinine Ratio: 7 — ABNORMAL LOW (ref 9–23)
BUN: 5 mg/dL — ABNORMAL LOW (ref 6–20)
CHLORIDE: 94 mmol/L — AB (ref 96–106)
CO2: 20 mmol/L (ref 20–29)
CREATININE: 0.7 mg/dL (ref 0.57–1.00)
Calcium: 9.6 mg/dL (ref 8.7–10.2)
GFR calc Af Amer: 142 mL/min/{1.73_m2} (ref >59)
GFR calc non Af Amer: 123 mL/min/{1.73_m2} (ref >59)
GLOBULIN, TOTAL: 2.8 g/dL (ref 1.5–4.5)
Glucose: 89 mg/dL (ref 65–99)
POTASSIUM: 3.6 mmol/L (ref 3.5–5.2)
Sodium: 128 mmol/L — ABNORMAL LOW (ref 134–144)
Total Protein: 7 g/dL (ref 6.0–8.5)

## 2018-01-21 LAB — AMYLASE: AMYLASE: 54 U/L (ref 31–124)

## 2018-01-21 LAB — LIPASE: Lipase: 25 U/L (ref 14–72)

## 2018-01-22 ENCOUNTER — Encounter: Payer: Self-pay | Admitting: Family Medicine

## 2018-01-22 LAB — URINE CULTURE

## 2018-01-23 ENCOUNTER — Other Ambulatory Visit: Payer: Self-pay | Admitting: Family Medicine

## 2018-01-23 ENCOUNTER — Encounter: Payer: Self-pay | Admitting: Family Medicine

## 2018-01-23 MED ORDER — METRONIDAZOLE 500 MG PO TABS: 500.0000 mg | ORAL_TABLET | Freq: Three times a day (TID) | ORAL | 0 refills | Status: DC

## 2018-01-23 MED ORDER — CIPROFLOXACIN HCL 500 MG PO TABS: 500.0000 mg | ORAL_TABLET | Freq: Two times a day (BID) | ORAL | 0 refills | Status: DC

## 2018-01-24 ENCOUNTER — Telehealth: Payer: Self-pay | Admitting: Physician Assistant

## 2018-01-24 NOTE — Telephone Encounter (Signed)
Left detailed message- ok'd per phone note.

## 2018-01-24 NOTE — Telephone Encounter (Signed)
Seen stacks on Friday 01/20/18.

## 2018-01-24 NOTE — Telephone Encounter (Signed)
lmtcb

## 2018-01-24 NOTE — Telephone Encounter (Signed)
Refer to previous phone note

## 2018-01-24 NOTE — Telephone Encounter (Signed)
cipro alone is the next best thing.(Pt should already be taking this.)

## 2018-03-13 ENCOUNTER — Other Ambulatory Visit: Payer: Self-pay | Admitting: Physician Assistant

## 2018-03-13 DIAGNOSIS — Z0000 Encounter for general adult medical examination without abnormal findings: Secondary | ICD-10-CM

## 2018-03-14 NOTE — Telephone Encounter (Signed)
Last PAP 09/24/16

## 2018-06-06 ENCOUNTER — Other Ambulatory Visit: Payer: Self-pay | Admitting: Physician Assistant

## 2018-06-06 DIAGNOSIS — Z0000 Encounter for general adult medical examination without abnormal findings: Secondary | ICD-10-CM

## 2018-06-17 ENCOUNTER — Encounter: Payer: Self-pay | Admitting: Nurse Practitioner

## 2018-06-17 ENCOUNTER — Ambulatory Visit (INDEPENDENT_AMBULATORY_CARE_PROVIDER_SITE_OTHER): Payer: 59 | Admitting: Nurse Practitioner

## 2018-06-17 DIAGNOSIS — K5100 Ulcerative (chronic) pancolitis without complications: Secondary | ICD-10-CM

## 2018-06-17 DIAGNOSIS — K5190 Ulcerative colitis, unspecified, without complications: Secondary | ICD-10-CM | POA: Insufficient documentation

## 2018-06-17 MED ORDER — VANCOMYCIN HCL 250 MG PO CAPS: 250.0000 mg | ORAL_CAPSULE | Freq: Four times a day (QID) | ORAL | 0 refills | Status: DC

## 2018-06-17 NOTE — Patient Instructions (Signed)
Ulcerative Colitis, Adult  Ulcerative colitis is long-lasting (chronic) swelling (inflammation) of the large intestine (colon). Sores (ulcers) may also form on the colon. Ulcerative colitis is closely related to another condition of inflammation of the intestines that is called Crohn disease. Together, they are frequently referred to as inflammatory bowel disease (IBD). What are the causes? Ulcerative colitis is caused by increased activity of the immune system in the intestines. The immune system is the system that protects the body against harmful bacteria, viruses, fungi, and other things that can make you sick. When the immune system overacts, it causes inflammation. The cause of the increased immune system activity is not known. What increases the risk? Risk factors of ulcerative colitis include:  Age. This includes: ? Being 20-19 years old. ? Being older than 23 years old.  Having a family history of ulcerative colitis.  Being of Jewish descent. What are the signs or symptoms? Common symptoms of ulcerative colitis include rectal bleeding and diarrhea. There is a wide range of symptoms, and a person's symptoms depend on how severe the condition is. Additional symptoms may include:  Pain or cramping in the belly (abdomen).  Fever.  Fatigue.  Weight loss.  Night sweats.  Rectal pain.  Feeling the immediate need to have a bowel movement.  Nausea.  Loss of appetite.  Anemia.  Joint pain or soreness.  Eye irritation.  Certain skin rashes. How is this diagnosed? Ulcerative colitis may be diagnosed by:  Medical history and physical exam.  Blood tests and stool tests.  X-rays.  CT scans.  Colonoscopy. For this test, a flexible tube is inserted into your anus and your colon is examined.  Examination of a tissue sample from your colon (biopsy). How is this treated? Treatment for ulcerative colitis may include medicines to:  Decrease inflammation.  Control  your immune system. Surgery may also be necessary. Follow these instructions at home: Medicines and vitamins  Take medicines only as directed by your doctor. Do not take aspirin.  Ask your doctor if you should take any vitamins or supplements. Lifestyle  Exercise regularly.  Limit alcohol intake to no more than 1 drink per day for nonpregnant women and 2 drinks per day for men. One drink equals 12 ounces of beer, 5 ounces of wine, or 1 ounces of hard liquor. Eating and drinking  Drink enough fluid to keep your urine clear or pale yellow.  Ask your health care provider about the best diet for you. Follow the diet as directed by your health care provider. This may include: ? Avoiding carbonated drinks. ? Avoiding popcorn, vegetable skins, nuts, and other high-fiber foods when you have symptoms of ulcerative colitis. ? Eating smaller meals more often. ? Keeping a food diary. This may help you to find and avoid any foods that make you feel not well.  Limit your caffeine intake. General instructions  Keep all follow-up appointments as directed by your health care provider. This is important. Contact a health care provider if:  Your symptoms do not improve or get worse with treatment.  You continue to lose weight.  You have constant cramps or loose bowels.  You develop a new skin rash, skin sores, or eye problems.  You have a fever or chills. Get help right away if:  You have bloody diarrhea.  You have severe pain in your abdomen.  You vomit. This information is not intended to replace advice given to you by your health care provider. Make sure you discuss any  questions you have with your health care provider. Document Released: 02/17/2005 Document Revised: 01/11/2016 Document Reviewed: 09/02/2014 Elsevier Interactive Patient Education  [employer].

## 2018-06-17 NOTE — Progress Notes (Signed)
   Subjective:    Patient ID: Sheryl Baxter, female    DOB: [birth year], 23 y.o.   MRN: [id number]   Chief Complaint: Ulcerative Colitis   HPI Patient comes in this morning c/o abdominal pain. She has a long hsitory of ulcerative colitis and has frequent flare ups. This most recent flare up started 2 days ago. Starts as a tight feeling in her stomach and she is not able to eat. Leads to diarrhea if she dos not take something for it quickly.   * her mom also has ulcerative colitis and she was diagnosed with c- diff earlier this week.  Review of Systems  Constitutional: Negative.   Respiratory: Negative.   Cardiovascular: Negative.   Gastrointestinal: Positive for abdominal pain and diarrhea.  Neurological: Negative.   Psychiatric/Behavioral: Negative.   All other systems reviewed and are negative.      Objective:   Physical Exam Constitutional:      General: She is in acute distress (mild).     Appearance: She is normal weight.  Cardiovascular:     Rate and Rhythm: Normal rate and regular rhythm.     Heart sounds: Normal heart sounds.  Pulmonary:     Effort: Pulmonary effort is normal.     Breath sounds: Normal breath sounds.  Abdominal:     General: Abdomen is flat. Bowel sounds are normal.     Tenderness: There is abdominal tenderness.  Skin:    General: Skin is warm.  Neurological:     General: No focal deficit present.     Mental Status: She is alert.  Psychiatric:        Mood and Affect: Mood normal.        Behavior: Behavior normal.    BP 113/69   Pulse 65   Temp (!) 97.4 F (36.3 C)   Ht 5' 2"  (1.575 m)   Wt 149 lb (67.6 kg)   BMI 27.25 kg/m       Assessment & Plan:  Courteney Alderete in today with chief complaint of Ulcerative Colitis   1. Ulcerative pancolitis without complication ([place]) Watch diet Force fluids Eat yogurt or take probiotic while on vancocin RTO prn - vancomycin (VANCOCIN) 250 MG capsule; Take 1 capsule (250 mg total) by mouth 4  (four) times daily.  Dispense: 40 capsule; Refill: 0  Mary-Margaret Hassell Done, FNP

## 2018-11-14 ENCOUNTER — Other Ambulatory Visit: Payer: Self-pay

## 2018-11-15 ENCOUNTER — Encounter: Payer: Self-pay | Admitting: Family Medicine

## 2018-11-15 ENCOUNTER — Ambulatory Visit (INDEPENDENT_AMBULATORY_CARE_PROVIDER_SITE_OTHER): Payer: 59 | Admitting: Family Medicine

## 2018-11-15 VITALS — BP 107/69 | HR 65 | Temp 98.7°F | Ht 62.0 in | Wt 148.0 lb

## 2018-11-15 DIAGNOSIS — Z3041 Encounter for surveillance of contraceptive pills: Secondary | ICD-10-CM

## 2018-11-15 DIAGNOSIS — N946 Dysmenorrhea, unspecified: Secondary | ICD-10-CM | POA: Diagnosis not present

## 2018-11-15 DIAGNOSIS — Z01419 Encounter for gynecological examination (general) (routine) without abnormal findings: Secondary | ICD-10-CM

## 2018-11-15 DIAGNOSIS — Z23 Encounter for immunization: Secondary | ICD-10-CM

## 2018-11-15 DIAGNOSIS — Z01411 Encounter for gynecological examination (general) (routine) with abnormal findings: Secondary | ICD-10-CM

## 2018-11-15 DIAGNOSIS — G479 Sleep disorder, unspecified: Secondary | ICD-10-CM

## 2018-11-15 MED ORDER — HYDROXYZINE HCL 25 MG PO TABS: 12.5000 mg | ORAL_TABLET | Freq: Every evening | ORAL | 0 refills | Status: DC | PRN

## 2018-11-15 MED ORDER — DESOGESTREL-ETHINYL ESTRADIOL 0.15-0.02/0.01 MG (21/5) PO TABS: 1.0000 | ORAL_TABLET | Freq: Every day | ORAL | 3 refills | Status: DC

## 2018-11-15 NOTE — Progress Notes (Signed)
Sheryl Baxter is a 23 y.o. female presents to office today for annual physical exam examination.    Concerns today include: 1.  Difficulty sleeping Patient reports several month history of difficulty with sleep.  She states that she normally has difficulty falling asleep but when she does fall asleep difficulty maintaining sleep.  She wakes up and often is unable to get back to sleep.  She not tried any medications to help with sleep yet.  She does report increased stress over the last couple months which may be contributing.  Overall she feels like mood during the day is fine.  2.  OCPs Patient reports history of dysmenorrhea.  She is sexually active with one partner.  They are in a monogamous relationship.  She denies any abnormal vaginal discharge, vaginal lesions.  She has had heavier more crampy menstrual cycles over the last couple months but again has been under increased stress over the last couple months.  She remembers to take her birth control pill every day.  Last menstrual cycle was last week.  Occupation: Actuary, Marital status: has boyfriend whom she resides with, Substance use: none Diet: normal (tries to watch her diet for GI reasons), Exercise: stays active with her 6 dogs Last eye exam: UTD, wears glasses. Last pap smear: 2018, normal Refills needed today: OCPs Immunizations needed: TDap  Past Medical History:  Diagnosis Date  . UTI (urinary tract infection) 03/30/2017   Social History   Socioeconomic History  . Marital status: Single    Spouse name: Not on file  . Number of children: Not on file  . Years of education: Not on file  . Highest education level: Not on file  Occupational History  . Not on file  Social Needs  . Financial resource strain: Not on file  . Food insecurity    Worry: Not on file    Inability: Not on file  . Transportation needs    Medical: Not on file    Non-medical: Not on file  Tobacco Use  . Smoking status: Never Smoker   . Smokeless tobacco: Never Used  Substance and Sexual Activity  . Alcohol use: No  . Drug use: No  . Sexual activity: Not Currently  Lifestyle  . Physical activity    Days per week: Not on file    Minutes per session: Not on file  . Stress: Not on file  Relationships  . Social Herbalist on phone: Not on file    Gets together: Not on file    Attends religious service: Not on file    Active member of club or organization: Not on file    Attends meetings of clubs or organizations: Not on file    Relationship status: Not on file  . Intimate partner violence    Fear of current or ex partner: Not on file    Emotionally abused: Not on file    Physically abused: Not on file    Forced sexual activity: Not on file  Other Topics Concern  . Not on file  Social History Narrative  . Not on file   No past surgical history on file. Family History  Problem Relation Age of Onset  . GER disease Mother   . Heart murmur Mother   . GER disease Father   . Sleep apnea Father   . Depression Maternal Grandmother   . Bipolar disorder Maternal Grandmother   . Osteoporosis Maternal Grandmother   . GER disease  Maternal Grandmother   . Heart disease Maternal Grandfather   . Cancer Paternal Grandmother        Esophagus cancer  . Heart disease Paternal Grandfather     Current Outpatient Medications:  Marland Kitchen  VIORELE 0.15-0.02/0.01 MG (21/5) tablet, TAKE 1 TABLET DAILY, Disp: 84 tablet, Rfl: 1  Allergies  Allergen Reactions  . Zyrtec [Cetirizine]      ROS: Review of Systems Constitutional: negative Eyes: positive for contacts/glasses Ears, nose, mouth, throat, and face: negative Respiratory: negative Cardiovascular: negative Gastrointestinal: positive for occasional abdominal pain w/ certain spicy foods Genitourinary:negative Integument/breast: negative Hematologic/lymphatic: negative Musculoskeletal:negative Neurological: negative Behavioral/Psych: diff sleep as above  Endocrine: negative Allergic/Immunologic: negative    Physical exam BP 107/69   Pulse 65   Temp 98.7 F (37.1 C) (Oral)   Ht 5' 2"  (1.575 m)   Wt 148 lb (67.1 kg)   LMP 11/08/2018   BMI 27.07 kg/m  General appearance: alert, cooperative, appears stated age and no distress Head: Normocephalic, without obvious abnormality, atraumatic Eyes: negative findings: lids and lashes normal, conjunctivae and sclerae normal, corneas clear and pupils equal, round, reactive to light and accomodation Ears: normal TM's and external ear canals both ears Nose: Nares normal. Septum midline. Mucosa normal. No drainage or sinus tenderness. Throat: lips, mucosa, and tongue normal; teeth and gums normal Neck: no adenopathy, supple, symmetrical, trachea midline and thyroid not enlarged, symmetric, no tenderness/mass/nodules Back: symmetric, no curvature. ROM normal. No CVA tenderness. Lungs: clear to auscultation bilaterally Heart: regular rate and rhythm, S1, S2 normal, no murmur, click, rub or gallop Abdomen: soft, non-tender; bowel sounds normal; no masses,  no organomegaly Extremities: extremities normal, atraumatic, no cyanosis or edema Pulses: 2+ and symmetric Skin: Skin color, texture, turgor normal. No rashes or lesions Lymph nodes: Cervical, supraclavicular, and axillary nodes normal. Neurologic: Alert and oriented X 3, normal strength and tone. Normal symmetric reflexes. Normal coordination and gait Psych: Mood stable, speech normal, affect appropriate, pleasant and interactive  Depression screen [facility] 2/9 11/15/2018 06/17/2018 01/20/2018  Decreased Interest 0 0 0  Down, Depressed, Hopeless 0 0 0  PHQ - 2 Score 0 0 0  Altered sleeping 0 0 -  Tired, decreased energy 0 0 -  Change in appetite 0 0 -  Feeling bad or failure about yourself  0 0 -  Trouble concentrating 0 0 -  Moving slowly or fidgety/restless 0 0 -  Suicidal thoughts 0 0 -  PHQ-9 Score 0 0 -   Assessment/ Plan: [name]  here for annual physical exam.   1. Well woman exam No need for GYN exam at this time.  Patient denied any vaginal concerns  2. Encounter for surveillance of contraceptive pills Some cramping lately but I think this may be related to stress.  Continue current OCP.  Check CMP and CBC - desogestrel-ethinyl estradiol (VIORELE) 0.15-0.02/0.01 MG (21/5) tablet; Take 1 tablet by mouth daily.  Dispense: 84 tablet; Refill: 3 - CMP14+EGFR - CBC  3. Sleep difficulties Likely related to stress.  Start Atarax as needed.  She has history of intolerance to Zyrtec due to dry eye.  She will contact me if she is having excessive dryness with this medication, at which point we can consider starting SSRI if needed.  We did discuss sleep hygiene and use of melatonin prior to use of any Atarax but she will use this if needed for excess anxiety at nighttime. - hydrOXYzine (ATARAX/VISTARIL) 25 MG tablet; Take 0.5-1 tablets (12.5-25 mg total) by  mouth at bedtime as needed for anxiety.  Dispense: 30 tablet; Refill: 0 - TSH  4. Dysmenorrhea As above - TSH - CMP14+EGFR - CBC  Handout on healthy lifestyle choices, including diet (rich in fruits, vegetables and lean meats and low in salt and simple carbohydrates) and exercise (at least 30 minutes of moderate physical activity daily).  Patient to follow up in 1 year for annual exam or sooner if needed.  Ashly M. Lajuana Ripple, DO

## 2018-11-15 NOTE — Addendum Note (Signed)
Addended byCarrolyn Leigh on: 11/15/2018 10:29 AM   Modules accepted: Orders

## 2018-11-15 NOTE — Patient Instructions (Signed)

## 2018-11-16 LAB — CMP14+EGFR
ALT: 11 IU/L (ref 0–32)
AST: 15 IU/L (ref 0–40)
Albumin/Globulin Ratio: 1.8 (ref 1.2–2.2)
Albumin: 4.2 g/dL (ref 3.9–5.0)
Alkaline Phosphatase: 52 IU/L (ref 39–117)
BUN/Creatinine Ratio: 10 (ref 9–23)
BUN: 7 mg/dL (ref 6–20)
Bilirubin Total: 0.5 mg/dL (ref 0.0–1.2)
CO2: 21 mmol/L (ref 20–29)
Calcium: 9.5 mg/dL (ref 8.7–10.2)
Chloride: 104 mmol/L (ref 96–106)
Creatinine, Ser: 0.67 mg/dL (ref 0.57–1.00)
GFR calc Af Amer: 143 mL/min/{1.73_m2} (ref >59)
GFR calc non Af Amer: 124 mL/min/{1.73_m2} (ref >59)
Globulin, Total: 2.3 g/dL (ref 1.5–4.5)
Glucose: 85 mg/dL (ref 65–99)
Potassium: 4.2 mmol/L (ref 3.5–5.2)
Sodium: 137 mmol/L (ref 134–144)
Total Protein: 6.5 g/dL (ref 6.0–8.5)

## 2018-11-16 LAB — CBC
Hematocrit: 40.6 % (ref 34.0–46.6)
Hemoglobin: 13.6 g/dL (ref 11.1–15.9)
MCH: 28.4 pg (ref 26.6–33.0)
MCHC: 33.5 g/dL (ref 31.5–35.7)
MCV: 85 fL (ref 79–97)
Platelets: 297 10*3/uL (ref 150–450)
RBC: 4.79 x10E6/uL (ref 3.77–5.28)
RDW: 12.6 % (ref 11.7–15.4)
WBC: 6.6 10*3/uL (ref 3.4–10.8)

## 2018-11-16 LAB — TSH: TSH: 1.25 u[IU]/mL (ref 0.450–4.500)

## 2018-11-20 ENCOUNTER — Encounter: Payer: 59 | Admitting: Physician Assistant

## 2018-12-07 ENCOUNTER — Other Ambulatory Visit: Payer: Self-pay | Admitting: Family Medicine

## 2018-12-07 DIAGNOSIS — G479 Sleep disorder, unspecified: Secondary | ICD-10-CM

## 2019-06-12 ENCOUNTER — Ambulatory Visit (INDEPENDENT_AMBULATORY_CARE_PROVIDER_SITE_OTHER): Payer: 59 | Admitting: Family Medicine

## 2019-06-12 ENCOUNTER — Other Ambulatory Visit: Payer: Self-pay

## 2019-06-12 DIAGNOSIS — N926 Irregular menstruation, unspecified: Secondary | ICD-10-CM

## 2019-06-12 NOTE — Progress Notes (Signed)
Telephone visit  Subjective: CC: OCP PCP: Janora Norlander, DO ZWC:HENIDPOE Sheryl Baxter is a 24 y.o. female calls for telephone consult today. Patient provides verbal consent for consult held via phone.  Due to COVID-19 pandemic this visit was conducted virtually. This visit type was conducted due to national recommendations for restrictions regarding the COVID-19 Pandemic (e.g. social distancing, sheltering in place) in an effort to limit this patient's exposure and mitigate transmission in our community. All issues noted in this document were discussed and addressed.  A physical exam was not performed with this format.   Location of patient: home Location of provider: Working remotely from home Others present for call: none  1.  Abnormal uterine bleeding Patient reports irregular menses over the last 2-3 months.  She notes that she is getting her menstrual cycle but it has only lasting 2 days and is severely heavy and painful.  She had been well controlled with her current birth control pill for quite some time.  She was previously having a menstrual cycle that was normal and not painful for 5 days.  She is sexually active.  Her last menstrual cycle was at the end of December.  She faithfully takes her oral contraceptive pill.  She has not been on any antibiotics or other new medicines over the last several months.  She has not missed any doses of medicines.  Denies any change in weight, increased stress.  ROS: Per HPI  Allergies  Allergen Reactions  . Zyrtec [Cetirizine]     Dry eye   Past Medical History:  Diagnosis Date  . UTI (urinary tract infection) 03/30/2017    Current Outpatient Medications:  .  desogestrel-ethinyl estradiol (VIORELE) 0.15-0.02/0.01 MG (21/5) tablet, Take 1 tablet by mouth daily., Disp: 84 tablet, Rfl: 3 .  hydrOXYzine (ATARAX/VISTARIL) 25 MG tablet, TAKE 1/2 TO 1 TABLET BY MOUTH AT BEDTIME AS NEEDED FOR ANXIETY, Disp: 90 tablet, Rfl: 0  Assessment/ Plan: 24  y.o. female   1. Irregular menses Uncertain etiology.  Will evaluate for possible metabolic etiology in pregnancy.  She did have a negative urine pregnancy at home.  If metabolic work-up is unremarkable, we will plan to transition over to a different birth control pill to see if perhaps this might be of benefit to the patient.  She was amenable to the plan and will come in today for labs.  I will contact her tomorrow with the results and we will discuss other birth control options. - Beta hCG quant (ref lab) - CBC - Thyroid Panel With TSH   Start time: 1:39pm End time: 1:45pm  Total time spent on patient care (including telephone call/ virtual visit): 15 minutes  [place], [place] [phone]

## 2019-06-13 LAB — BETA HCG QUANT (REF LAB): hCG Quant: <1 m[IU]/mL

## 2019-06-13 LAB — CBC
Hematocrit: 42.6 % (ref 34.0–46.6)
Hemoglobin: 14.6 g/dL (ref 11.1–15.9)
MCH: 29.1 pg (ref 26.6–33.0)
MCHC: 34.3 g/dL (ref 31.5–35.7)
MCV: 85 fL (ref 79–97)
Platelets: 301 10*3/uL (ref 150–450)
RBC: 5.02 x10E6/uL (ref 3.77–5.28)
RDW: 12.5 % (ref 11.7–15.4)
WBC: 7.1 10*3/uL (ref 3.4–10.8)

## 2019-06-13 LAB — THYROID PANEL WITH TSH
Free Thyroxine Index: 2 (ref 1.2–4.9)
T3 Uptake Ratio: 16 % — ABNORMAL LOW (ref 24–39)
T4, Total: 12.7 ug/dL — ABNORMAL HIGH (ref 4.5–12.0)
TSH: 0.752 u[IU]/mL (ref 0.450–4.500)

## 2019-06-20 ENCOUNTER — Encounter: Payer: Self-pay | Admitting: Family Medicine

## 2019-06-25 ENCOUNTER — Encounter: Payer: Self-pay | Admitting: Family Medicine

## 2019-06-27 ENCOUNTER — Ambulatory Visit (INDEPENDENT_AMBULATORY_CARE_PROVIDER_SITE_OTHER): Payer: 59 | Admitting: Family Medicine

## 2019-06-27 DIAGNOSIS — N926 Irregular menstruation, unspecified: Secondary | ICD-10-CM

## 2019-06-27 DIAGNOSIS — R7989 Other specified abnormal findings of blood chemistry: Secondary | ICD-10-CM | POA: Diagnosis not present

## 2019-06-27 DIAGNOSIS — Z8349 Family history of other endocrine, nutritional and metabolic diseases: Secondary | ICD-10-CM

## 2019-06-27 NOTE — Progress Notes (Signed)
Telephone visit  Subjective: CC: f/u T4, menses PCP: Janora Norlander, DO EGB:[id number] Dicostanzo is a 24 y.o. female calls for telephone consult today. Patient provides verbal consent for consult held via phone.  Due to COVID-19 pandemic this visit was conducted virtually. This visit type was conducted due to national recommendations for restrictions regarding the COVID-19 Pandemic (e.g. social distancing, sheltering in place) in an effort to limit this patient's exposure and mitigate transmission in our community. All issues noted in this document were discussed and addressed.  A physical exam was not performed with this format.   Location of patient: [employer] parking lot Location of provider: Working remotely from home Others present for call: none  1.  Abnormal menstrual cycles Patient has ongoing abnormalities within her menstrual cycle.  She reports compliance with her OCP.  Her most recent menstrual cycle only lasted about 2-1/2 days.  The first day was extremely painful.  She has had negative serum pregnancy tests.  Her last thyroid panel showed an elevated free T4 level but normal TSH.  She goes on to state that she has had issues with fatigue and weight gain.  After further discussion with her family it was found that multiple family members indeed have a thyroid disorder.  She is not sure if this is hyper or hypothyroidism though.   ROS: Per HPI  Allergies  Allergen Reactions  . Zyrtec [Cetirizine]     Dry eye   Past Medical History:  Diagnosis Date  . UTI (urinary tract infection) 03/30/2017    Current Outpatient Medications:  .  desogestrel-ethinyl estradiol (VIORELE) 0.15-0.02/0.01 MG (21/5) tablet, Take 1 tablet by mouth daily., Disp: 84 tablet, Rfl: 3 .  hydrOXYzine (ATARAX/VISTARIL) 25 MG tablet, TAKE 1/2 TO 1 TABLET BY MOUTH AT BEDTIME AS NEEDED FOR ANXIETY, Disp: 90 tablet, Rfl: 0  Assessment/ Plan: 24 y.o. female   1. Irregular menses Ongoing irregular menstrual  cycles.  While they are monthly, they do not last the normal amount of time and they have significantly increased and cramping and discomfort.  We discussed the possibility that her thyroid levels could be impacting her flow.  However, we also discussed that sometimes acute distress responses/illness can impact thyroid levels.  Difficult to tell which is the causation of her menstrual cycle issues.  We will await her repeat thyroid levels before making a change in her birth control pill.  She will come into the office in about 3-1/2 weeks to have this level repeated.  2. Family history of thyroid disease Strong family history of unknown thyroid disorder in multiple family members.  3. Elevated serum free T4 level Having some symptomatology (fatigue, weight gain, etc.) however, her T4 level was of anything on the higher side indicating a possible underlying hyperthyroidism.  I will collect thyroid panel along with thyroid antibodies to evaluate this further given strong family history.  Further work-up and recommendations pending these lab results. - Thyroid Panel With TSH; Future - Thyroid Peroxidase Antibody; Future - Thyroglobulin antibody; Future - Thyrotropin receptor autoabs; Future   Start time: 11:03am End time: 11:15am  Total time spent on patient care (including telephone call/ virtual visit): 21 minutes  [place], [place] [phone]

## 2019-06-27 NOTE — Patient Instructions (Signed)
Hypothyroidism (Hashimoto's)  Hypothyroidism is when the thyroid gland does not make enough of certain hormones (it is underactive). The thyroid gland is a small gland located in the lower front part of the neck, just in front of the windpipe (trachea). This gland makes hormones that help control how the body uses food for energy (metabolism) as well as how the heart and brain function. These hormones also play a role in keeping your bones strong. When the thyroid is underactive, it produces too little of the hormones thyroxine (T4) and triiodothyronine (T3). What are the causes? This condition may be caused by:  Hashimoto's disease. This is a disease in which the body's disease-fighting system (immune system) attacks the thyroid gland. This is the most common cause.  Viral infections.  Pregnancy.  Certain medicines.  Birth defects.  Past radiation treatments to the head or neck for cancer.  Past treatment with radioactive iodine.  Past exposure to radiation in the environment.  Past surgical removal of part or all of the thyroid.  Problems with a gland in the center of the brain (pituitary gland).  Lack of enough iodine in the diet. What increases the risk? You are more likely to develop this condition if:  You are female.  You have a family history of thyroid conditions.  You use a medicine called lithium.  You take medicines that affect the immune system (immunosuppressants). What are the signs or symptoms? Symptoms of this condition include:  Feeling as though you have no energy (lethargy).  Not being able to tolerate cold.  Weight gain that is not explained by a change in diet or exercise habits.  Lack of appetite.  Dry skin.  Coarse hair.  Menstrual irregularity.  Slowing of thought processes.  Constipation.  Sadness or depression. How is this diagnosed? This condition may be diagnosed based on:  Your symptoms, your medical history, and a physical  exam.  Blood tests. You may also have imaging tests, such as an ultrasound or MRI. How is this treated? This condition is treated with medicine that replaces the thyroid hormones that your body does not make. After you begin treatment, it may take several weeks for symptoms to go away. Follow these instructions at home:  Take over-the-counter and prescription medicines only as told by your health care provider.  If you start taking any new medicines, tell your health care provider.  Keep all follow-up visits as told by your health care provider. This is important. ? As your condition improves, your dosage of thyroid hormone medicine may change. ? You will need to have blood tests regularly so that your health care provider can monitor your condition. Contact a health care provider if:  Your symptoms do not get better with treatment.  You are taking thyroid replacement medicine and you: ? Sweat a lot. ? Have tremors. ? Feel anxious. ? Lose weight rapidly. ? Cannot tolerate heat. ? Have emotional swings. ? Have diarrhea. ? Feel weak. Get help right away if you have:  Chest pain.  An irregular heartbeat.  A rapid heartbeat.  Difficulty breathing. Summary  Hypothyroidism is when the thyroid gland does not make enough of certain hormones (it is underactive).  When the thyroid is underactive, it produces too little of the hormones thyroxine (T4) and triiodothyronine (T3).  The most common cause is Hashimoto's disease, a disease in which the body's disease-fighting system (immune system) attacks the thyroid gland. The condition can also be caused by viral infections, medicine, pregnancy, or  past radiation treatment to the head or neck.  Symptoms may include weight gain, dry skin, constipation, feeling as though you do not have energy, and not being able to tolerate cold.  This condition is treated with medicine to replace the thyroid hormones that your body does not  make. This information is not intended to replace advice given to you by your health care provider. Make sure you discuss any questions you have with your health care provider. Document Revised: 04/22/2017 Document Reviewed: 04/20/2017 Elsevier Patient Education  2020 [employer].  Hyperthyroidism (Grave's)  Hyperthyroidism is when the thyroid gland is too active (overactive). The thyroid gland is a small gland located in the lower front part of the neck, just in front of the windpipe (trachea). This gland makes hormones that help control how the body uses food for energy (metabolism) as well as how the heart and brain function. These hormones also play a role in keeping your bones strong. When the thyroid is overactive, it produces too much of a hormone called thyroxine. What are the causes? This condition may be caused by:  Graves' disease. This is a disorder in which the body's disease-fighting system (immune system) attacks the thyroid gland. This is the most common cause.  Inflammation of the thyroid gland.  A tumor in the thyroid gland.  Use of certain medicines, including: ? Prescription thyroid hormone replacement. ? Herbal supplements that mimic thyroid hormones. ? Amiodarone therapy.  Solid or fluid-filled lumps within your thyroid gland (thyroid nodules).  Taking in a large amount of iodine from foods or medicines. What increases the risk? You are more likely to develop this condition if:  You are female.  You have a family history of thyroid conditions.  You smoke tobacco.  You use a medicine called lithium.  You take medicines that affect the immune system (immunosuppressants). What are the signs or symptoms? Symptoms of this condition include:  Nervousness.  Inability to tolerate heat.  Unexplained weight loss.  Diarrhea.  Change in the texture of hair or skin.  Heart skipping beats or making extra beats.  Rapid heart rate.  Loss of  menstruation.  Shaky hands.  Fatigue.  Restlessness.  Sleep problems.  Enlarged thyroid gland or a lump in the thyroid (nodule). You may also have symptoms of Graves' disease, which may include:  Protruding eyes.  Dry eyes.  Red or swollen eyes.  Problems with vision. How is this diagnosed? This condition may be diagnosed based on:  Your symptoms and medical history.  A physical exam.  Blood tests.  Thyroid ultrasound. This test involves using sound waves to produce images of the thyroid gland.  A thyroid scan. A radioactive substance is injected into a vein, and images show how much iodine is present in the thyroid.  Radioactive iodine uptake test (RAIU). A small amount of radioactive iodine is given by mouth to see how much iodine the thyroid absorbs after a certain amount of time. How is this treated? Treatment depends on the cause and severity of the condition. Treatment may include:  Medicines to reduce the amount of thyroid hormone your body makes.  Radioactive iodine treatment (radioiodine therapy). This involves swallowing a small dose of radioactive iodine, in capsule or liquid form, to kill thyroid cells.  Surgery to remove part or all of your thyroid gland. You may need to take thyroid hormone replacement medicine for the rest of your life after thyroid surgery.  Medicines to help manage your symptoms. Follow these instructions  at home:   Take over-the-counter and prescription medicines only as told by your health care provider.  Do not use any products that contain nicotine or tobacco, such as cigarettes and e-cigarettes. If you need help quitting, ask your health care provider.  Follow any instructions from your health care provider about diet. You may be instructed to limit foods that contain iodine.  Keep all follow-up visits as told by your health care provider. This is important. ? You will need to have blood tests regularly so that your health  care provider can monitor your condition. Contact a health care provider if:  Your symptoms do not get better with treatment.  You have a fever.  You are taking thyroid hormone replacement medicine and you: ? Have symptoms of depression. ? Feel like you are tired all the time. ? Gain weight. Get help right away if:  You have chest pain.  You have decreased alertness or a change in your awareness.  You have abdominal pain.  You feel dizzy.  You have a rapid heartbeat.  You have an irregular heartbeat.  You have difficulty breathing. Summary  The thyroid gland is a small gland located in the lower front part of the neck, just in front of the windpipe (trachea).  Hyperthyroidism is when the thyroid gland is too active (overactive) and produces too much of a hormone called thyroxine.  The most common cause is Graves' disease, a disorder in which your immune system attacks the thyroid gland.  Hyperthyroidism can cause various symptoms, such as unexplained weight loss, nervousness, inability to tolerate heat, or changes in your heartbeat.  Treatment may include medicine to reduce the amount of thyroid hormone your body makes, radioiodine therapy, surgery, or medicines to manage symptoms. This information is not intended to replace advice given to you by your health care provider. Make sure you discuss any questions you have with your health care provider. Document Revised: 04/22/2017 Document Reviewed: 04/20/2017 Elsevier Patient Education  2020 [employer].

## 2019-07-25 ENCOUNTER — Other Ambulatory Visit: Payer: Self-pay

## 2019-07-25 ENCOUNTER — Other Ambulatory Visit: Payer: 59

## 2019-07-25 DIAGNOSIS — R7989 Other specified abnormal findings of blood chemistry: Secondary | ICD-10-CM

## 2019-07-26 ENCOUNTER — Encounter: Payer: Self-pay | Admitting: Family Medicine

## 2019-07-26 ENCOUNTER — Telehealth: Payer: Self-pay | Admitting: Family Medicine

## 2019-07-26 LAB — THYROID PANEL WITH TSH
Free Thyroxine Index: 2.3 (ref 1.2–4.9)
T3 Uptake Ratio: 19 % — ABNORMAL LOW (ref 24–39)
T4, Total: 12.2 ug/dL — ABNORMAL HIGH (ref 4.5–12.0)
TSH: 0.861 u[IU]/mL (ref 0.450–4.500)

## 2019-07-26 LAB — THYROID PEROXIDASE ANTIBODY: Thyroperoxidase Ab SerPl-aCnc: 12 IU/mL (ref 0–34)

## 2019-07-26 LAB — THYROTROPIN RECEPTOR AUTOABS: Thyrotropin Receptor Ab: <1.1 IU/L (ref 0.00–1.75)

## 2019-07-26 LAB — THYROGLOBULIN ANTIBODY: Thyroglobulin Antibody: <1 IU/mL (ref 0.0–0.9)

## 2019-07-26 NOTE — Telephone Encounter (Signed)
Attempted to contact patient - NA °

## 2019-07-27 ENCOUNTER — Telehealth: Payer: Self-pay | Admitting: Family Medicine

## 2019-07-27 ENCOUNTER — Other Ambulatory Visit: Payer: Self-pay | Admitting: *Deleted

## 2019-07-27 ENCOUNTER — Encounter: Payer: Self-pay | Admitting: Family Medicine

## 2019-07-27 DIAGNOSIS — R7989 Other specified abnormal findings of blood chemistry: Secondary | ICD-10-CM

## 2019-07-27 NOTE — Telephone Encounter (Signed)
Aware of results and would like to see endocrinology.  Referral place.

## 2019-07-27 NOTE — Telephone Encounter (Signed)
Patient has been discussing through My chart with Dr. Lajuana Ripple

## 2019-07-27 NOTE — Telephone Encounter (Signed)
Referral Department made aware

## 2019-08-14 ENCOUNTER — Telehealth: Payer: Self-pay | Admitting: Family Medicine

## 2019-08-14 NOTE — Telephone Encounter (Signed)
We can proceed with an alternative birth control (would probably recommend higher potency than what she is on) vs referral to OB/GYN.  No further work up needed of thyroid, as apparently these levels are falsely abnormal due to her birth control pills.    Let me know how she would like to proceed.

## 2019-08-15 ENCOUNTER — Other Ambulatory Visit: Payer: Self-pay | Admitting: Family Medicine

## 2019-08-15 DIAGNOSIS — N926 Irregular menstruation, unspecified: Secondary | ICD-10-CM

## 2019-08-15 MED ORDER — NORGESTIM-ETH ESTRAD TRIPHASIC 0.18/0.215/0.25 MG-35 MCG PO TABS: 1.0000 | ORAL_TABLET | Freq: Every day | ORAL | 4 refills | Status: DC

## 2019-08-15 NOTE — Telephone Encounter (Signed)
I spoke with patient & she is ok starting a new form of BC - She stated she gets her Medication through [facility] - It is the same pharm listed on her 10/2018 medication. :)

## 2019-08-31 ENCOUNTER — Telehealth: Payer: Self-pay | Admitting: Family Medicine

## 2019-08-31 DIAGNOSIS — N926 Irregular menstruation, unspecified: Secondary | ICD-10-CM

## 2019-08-31 MED ORDER — NORGESTIM-ETH ESTRAD TRIPHASIC 0.18/0.215/0.25 MG-35 MCG PO TABS: 1.0000 | ORAL_TABLET | Freq: Every day | ORAL | 4 refills | Status: DC

## 2019-08-31 NOTE — Telephone Encounter (Signed)
Patient's birth control was sent to mail order, she will not receive for a few days and has just finished her other pack.  Needs to have a prescription sent to [facility] so she can start pills while waiting on prescription from mail order.  Prescription sent in.

## 2019-11-30 ENCOUNTER — Other Ambulatory Visit: Payer: Self-pay

## 2019-11-30 ENCOUNTER — Encounter: Payer: Self-pay | Admitting: Family Medicine

## 2019-11-30 ENCOUNTER — Other Ambulatory Visit (HOSPITAL_COMMUNITY)
Admission: RE | Admit: 2019-11-30 | Discharge: 2019-11-30 | Disposition: A | Discharge status: Home / Self Care | Payer: 59 | Source: Ambulatory Visit | Attending: Family Medicine | Admitting: Family Medicine

## 2019-11-30 ENCOUNTER — Ambulatory Visit (INDEPENDENT_AMBULATORY_CARE_PROVIDER_SITE_OTHER): Payer: 59 | Admitting: Family Medicine

## 2019-11-30 VITALS — BP 96/56 | HR 67 | Temp 99.0°F | Ht 62.0 in | Wt 149.2 lb

## 2019-11-30 DIAGNOSIS — F5101 Primary insomnia: Secondary | ICD-10-CM

## 2019-11-30 DIAGNOSIS — Z01419 Encounter for gynecological examination (general) (routine) without abnormal findings: Secondary | ICD-10-CM

## 2019-11-30 DIAGNOSIS — Z124 Encounter for screening for malignant neoplasm of cervix: Secondary | ICD-10-CM | POA: Diagnosis not present

## 2019-11-30 DIAGNOSIS — R110 Nausea: Secondary | ICD-10-CM | POA: Insufficient documentation

## 2019-11-30 DIAGNOSIS — G44209 Tension-type headache, unspecified, not intractable: Secondary | ICD-10-CM

## 2019-11-30 DIAGNOSIS — Z01411 Encounter for gynecological examination (general) (routine) with abnormal findings: Secondary | ICD-10-CM | POA: Diagnosis not present

## 2019-11-30 LAB — CMP14+EGFR
ALT: 14 IU/L (ref 0–32)
AST: 17 IU/L (ref 0–40)
Albumin/Globulin Ratio: 1.9 (ref 1.2–2.2)
Albumin: 4.3 g/dL (ref 3.9–5.0)
Alkaline Phosphatase: 48 IU/L (ref 48–121)
BUN/Creatinine Ratio: 13 (ref 9–23)
BUN: 8 mg/dL (ref 6–20)
Bilirubin Total: 0.5 mg/dL (ref 0.0–1.2)
CO2: 21 mmol/L (ref 20–29)
Calcium: 9.3 mg/dL (ref 8.7–10.2)
Chloride: 101 mmol/L (ref 96–106)
Creatinine, Ser: 0.63 mg/dL (ref 0.57–1.00)
GFR calc Af Amer: 145 mL/min/{1.73_m2} (ref >59)
GFR calc non Af Amer: 126 mL/min/{1.73_m2} (ref >59)
Globulin, Total: 2.3 g/dL (ref 1.5–4.5)
Glucose: 92 mg/dL (ref 65–99)
Potassium: 4 mmol/L (ref 3.5–5.2)
Sodium: 135 mmol/L (ref 134–144)
Total Protein: 6.6 g/dL (ref 6.0–8.5)

## 2019-11-30 LAB — CBC WITH DIFFERENTIAL/PLATELET
Basophils Absolute: 0 10*3/uL (ref 0.0–0.2)
Basos: 1 %
EOS (ABSOLUTE): 0.1 10*3/uL (ref 0.0–0.4)
Eos: 2 %
Hematocrit: 42.6 % (ref 34.0–46.6)
Hemoglobin: 14 g/dL (ref 11.1–15.9)
Immature Grans (Abs): 0 10*3/uL (ref 0.0–0.1)
Immature Granulocytes: 0 %
Lymphocytes Absolute: 2.6 10*3/uL (ref 0.7–3.1)
Lymphs: 43 %
MCH: 29 pg (ref 26.6–33.0)
MCHC: 32.9 g/dL (ref 31.5–35.7)
MCV: 88 fL (ref 79–97)
Monocytes Absolute: 0.6 10*3/uL (ref 0.1–0.9)
Monocytes: 10 %
Neutrophils Absolute: 2.7 10*3/uL (ref 1.4–7.0)
Neutrophils: 44 %
Platelets: 281 10*3/uL (ref 150–450)
RBC: 4.83 x10E6/uL (ref 3.77–5.28)
RDW: 13.2 % (ref 11.7–15.4)
WBC: 6 10*3/uL (ref 3.4–10.8)

## 2019-11-30 LAB — TSH: TSH: 1.64 u[IU]/mL (ref 0.450–4.500)

## 2019-11-30 MED ORDER — TRAZODONE HCL 50 MG PO TABS: 25.0000 mg | ORAL_TABLET | Freq: Every evening | ORAL | 3 refills | Status: DC | PRN

## 2019-11-30 MED ORDER — PANTOPRAZOLE SODIUM 40 MG PO TBEC: 40.0000 mg | DELAYED_RELEASE_TABLET | Freq: Every day | ORAL | 0 refills | Status: DC

## 2019-11-30 NOTE — Patient Instructions (Signed)
Preventive Care 21-24 Years Old, Female Preventive care refers to visits with your health care provider and lifestyle choices that can promote health and wellness. This includes:  A yearly physical exam. This may also be called an annual well check.  Regular dental visits and eye exams.  Immunizations.  Screening for certain conditions.  Healthy lifestyle choices, such as eating a healthy diet, getting regular exercise, not using drugs or products that contain nicotine and tobacco, and limiting alcohol use. What can I expect for my preventive care visit? Physical exam Your health care provider will check your:  Height and weight. This may be used to calculate body mass index (BMI), which tells if you are at a healthy weight.  Heart rate and blood pressure.  Skin for abnormal spots. Counseling Your health care provider may ask you questions about your:  Alcohol, tobacco, and drug use.  Emotional well-being.  Home and relationship well-being.  Sexual activity.  Eating habits.  Work and work environment.  Method of birth control.  Menstrual cycle.  Pregnancy history. What immunizations do I need?  Influenza (flu) vaccine  This is recommended every year. Tetanus, diphtheria, and pertussis (Tdap) vaccine  You may need a Td booster every 10 years. Varicella (chickenpox) vaccine  You may need this if you have not been vaccinated. Human papillomavirus (HPV) vaccine  If recommended by your health care provider, you may need three doses over 6 months. Measles, mumps, and rubella (MMR) vaccine  You may need at least one dose of MMR. You may also need a second dose. Meningococcal conjugate (MenACWY) vaccine  One dose is recommended if you are age 19-21 years and a first-year college student living in a residence hall, or if you have one of several medical conditions. You may also need additional booster doses. Pneumococcal conjugate (PCV13) vaccine  You may need  this if you have certain conditions and were not previously vaccinated. Pneumococcal polysaccharide (PPSV23) vaccine  You may need one or two doses if you smoke cigarettes or if you have certain conditions. Hepatitis A vaccine  You may need this if you have certain conditions or if you travel or work in places where you may be exposed to hepatitis A. Hepatitis B vaccine  You may need this if you have certain conditions or if you travel or work in places where you may be exposed to hepatitis B. Haemophilus influenzae type b (Hib) vaccine  You may need this if you have certain conditions. You may receive vaccines as individual doses or as more than one vaccine together in one shot (combination vaccines). Talk with your health care provider about the risks and benefits of combination vaccines. What tests do I need?  Blood tests  Lipid and cholesterol levels. These may be checked every 5 years starting at age 20.  Hepatitis C test.  Hepatitis B test. Screening  Diabetes screening. This is done by checking your blood sugar (glucose) after you have not eaten for a while (fasting).  Sexually transmitted disease (STD) testing.  BRCA-related cancer screening. This may be done if you have a family history of breast, ovarian, tubal, or peritoneal cancers.  Pelvic exam and Pap test. This may be done every 3 years starting at age 21. Starting at age 30, this may be done every 5 years if you have a Pap test in combination with an HPV test. Talk with your health care provider about your test results, treatment options, and if necessary, the need for more tests.   Follow these instructions at home: Eating and drinking   Eat a diet that includes fresh fruits and vegetables, whole grains, lean protein, and low-fat dairy.  Take vitamin and mineral supplements as recommended by your health care provider.  Do not drink alcohol if: ? Your health care provider tells you not to drink. ? You are  pregnant, may be pregnant, or are planning to become pregnant.  If you drink alcohol: ? Limit how much you have to 0-1 drink a day. ? Be aware of how much alcohol is in your drink. In the [place] one drink equals one 12 oz bottle of beer (355 mL), one 5 oz glass of wine (148 mL), or one 1 oz glass of hard liquor (44 mL). Lifestyle  Take daily care of your teeth and gums.  Stay active. Exercise for at least 30 minutes on 5 or more days each week.  Do not use any products that contain nicotine or tobacco, such as cigarettes, e-cigarettes, and chewing tobacco. If you need help quitting, ask your health care provider.  If you are sexually active, practice safe sex. Use a condom or other form of birth control (contraception) in order to prevent pregnancy and STIs (sexually transmitted infections). If you plan to become pregnant, see your health care provider for a preconception visit. What's next?  Visit your health care provider once a year for a well check visit.  Ask your health care provider how often you should have your eyes and teeth checked.  Stay up to date on all vaccines. This information is not intended to replace advice given to you by your health care provider. Make sure you discuss any questions you have with your health care provider. Document Revised: 01/19/2018 Document Reviewed: 01/19/2018 Elsevier Patient Education  2020 [employer].

## 2019-11-30 NOTE — Progress Notes (Signed)
Sheryl Baxter is a 24 y.o. female presents to office today for annual physical exam examination.    Concerns today include: 1.  Insomnia, headache Patient reports that she is had quite a bit of difficulty sleeping.  She does admit to regular caffeine intake in the form of sodas.  However, she has difficulty falling asleep even on the days that she does not drink soda.  She tried a month of melatonin which did not help.  She does use hydroxyzine again, this did not help.  She is unable to fall asleep typically but is able to stay asleep when she does fall asleep.  Sometimes she will only get a few hours of sleep per night.  She is also noticed a tension headache that occurs behind her eyes which typically occurs after work.  She feels that she is hydrating adequately.  Denies any eyestrain but does wear glasses that are not over a year old.  Denies increase in stress or anxiety.  2.  Nausea Patient reports that over the last week she is been having some intermittent nausea.  She denies any associate abdominal pain, reflux symptoms.  She is on an oral birth control pill and sometimes misses doses but doubles the next day.  She does have unprotected intercourse with her boyfriend, whom she resides with.  She has taken no medicines for this.  Denies any hematochezia, melena, diarrhea, constipation or fevers.  Occupation: [employer], Marital status: lives with byfriend, Substance use: none Diet: fair, Exercise: no structured Last eye exam: UTD Last pap smear: needs  Past Medical History:  Diagnosis Date   UTI (urinary tract infection) 03/30/2017   Social History   Socioeconomic History   Marital status: Single    Spouse name: Not on file   Number of children: Not on file   Years of education: Not on file   Highest education level: Not on file  Occupational History   Not on file  Tobacco Use   Smoking status: Never Smoker   Smokeless tobacco: Never Used  Vaping Use   Vaping Use:  Never used  Substance and Sexual Activity   Alcohol use: No   Drug use: No   Sexual activity: Not Currently  Other Topics Concern   Not on file  Social History Narrative   Not on file   Social Determinants of Health   Financial Resource Strain:    Difficulty of Paying Living Expenses:   Food Insecurity:    Worried About Charity fundraiser in the Last Year:    Arboriculturist in the Last Year:   Transportation Needs:    Film/video editor (Medical):    Lack of Transportation (Non-Medical):   Physical Activity:    Days of Exercise per Week:    Minutes of Exercise per Session:   Stress:    Feeling of Stress :   Social Connections:    Frequency of Communication with Friends and Family:    Frequency of Social Gatherings with Friends and Family:    Attends Religious Services:    Active Member of Clubs or Organizations:    Attends Music therapist:    Marital Status:   Intimate Partner Violence:    Fear of Current or Ex-Partner:    Emotionally Abused:    Physically Abused:    Sexually Abused:    History reviewed. No pertinent surgical history. Family History  Problem Relation Age of Onset   GER disease Mother  Heart murmur Mother    GER disease Father    Sleep apnea Father    Depression Maternal Grandmother    Bipolar disorder Maternal Grandmother    Osteoporosis Maternal Grandmother    GER disease Maternal Grandmother    Heart disease Maternal Grandfather    Cancer Paternal Grandmother        Esophagus cancer   Heart disease Paternal Grandfather     Current Outpatient Medications:    Norgestimate-Ethinyl Estradiol Triphasic (ORTHO TRI-CYCLEN, 28,) 0.18/0.215/0.25 MG-35 MCG tablet, Take 1 tablet by mouth daily., Disp: 3 Package, Rfl: 4  Allergies  Allergen Reactions   Zyrtec [Cetirizine]     Dry eye     ROS: Review of Systems Pertinent items noted in HPI and remainder of comprehensive ROS otherwise  negative.    Physical exam BP (!) 96/56    Pulse 67    Temp 99 F (37.2 C)    Ht _0  (1.575 m)    Wt 149 lb 3.2 oz (67.7 kg)    SpO2 99%    BMI 27.29 kg/m  General appearance: alert, cooperative, appears stated age and no distress Head: Normocephalic, without obvious abnormality, atraumatic Eyes: negative findings: lids and lashes normal, conjunctivae and sclerae normal, corneas clear and pupils equal, round, reactive to light and accomodation Ears: normal TM's and external ear canals both ears Nose: Nares normal. Septum midline. Mucosa normal. No drainage or sinus tenderness. Throat: lips, mucosa, and tongue normal; teeth and gums normal Neck: no adenopathy, supple, symmetrical, trachea midline and thyroid not enlarged, symmetric, no tenderness/mass/nodules Back: symmetric, no curvature. ROM normal. No CVA tenderness. Lungs: clear to auscultation bilaterally Heart: regular rate and rhythm, S1, S2 normal, no murmur, click, rub or gallop Abdomen: soft, non-tender; bowel sounds normal; no masses,  no organomegaly Pelvic: external genitalia normal, no adnexal masses or tenderness, no cervical motion tenderness, rectovaginal septum normal, uterus normal size, shape, and consistency, vagina normal without discharge and Mild hyperemia of the cervix noted.  Cervix is to patient's anatomical left Extremities: extremities normal, atraumatic, no cyanosis or edema Pulses: 2+ and symmetric Skin: Skin color, texture, turgor normal. No rashes or lesions Lymph nodes: Cervical, supraclavicular, and axillary nodes normal. Neurologic: Alert and oriented X 3, normal strength and tone. Normal symmetric reflexes. Normal coordination and gait Psych: mood stable, speech well, affect appropriate, pleasant and interactive  Depression screen [facility] 2/9 11/30/2019 11/15/2018 06/17/2018  Decreased Interest 0 0 0  Down, Depressed, Hopeless 0 0 0  PHQ - 2 Score 0 0 0  Altered sleeping - 0 0  Tired, decreased energy - 0 0   Change in appetite - 0 0  Feeling bad or failure about yourself  - 0 0  Trouble concentrating - 0 0  Moving slowly or fidgety/restless - 0 0  Suicidal thoughts - 0 0  PHQ-9 Score - 0 0      Assessment/ Plan: Sheryl Baxter here for annual physical exam.   1. Well woman exam with routine gynecological exam  2. Screening for malignant neoplasm of cervix - Cytology - PAP  3. Primary insomnia Refractory to conservative measures.  Trazodone prescribed.  Instructions for use discussed. - traZODone (DESYREL) 50 MG tablet; Take 0.5-2 tablets (25-100 mg total) by mouth at bedtime as needed for sleep.  Dispense: 30 tablet; Refill: 3 - CMP14+EGFR - TSH - CBC with Differential  4. Tension headache Likely due to lack of sleep.  But we discussed other things that may impact tension headaches.  Advised to hydrate, rest.  If symptoms worsen or she develops any red flags she is to see me back.  If symptoms do not resolve, may need to consider further work-up as well - CMP14+EGFR - TSH - CBC with Differential  5. Nausea Acute.  No red flag signs or symptoms.  Trial of PPI for 2 weeks to see if this resolves symptoms.  Given inconsistencies in birth control will obtain hCG but patient did have a period within the last 2 weeks. - pantoprazole (PROTONIX) 40 MG tablet; Take 1 tablet (40 mg total) by mouth daily for 14 days.  Dispense: 14 tablet; Refill: 0 - CMP14+EGFR - TSH - CBC with Differential - Cytology - PAP - Beta hCG quant (ref lab)   Handout on healthy lifestyle choices, including diet (rich in fruits, vegetables and lean meats and low in salt and simple carbohydrates) and exercise (at least 30 minutes of moderate physical activity daily).  Patient to follow up in 1 year for annual exam or sooner if needed.  Sheryl Baxter M. Lajuana Ripple, DO

## 2019-12-03 ENCOUNTER — Encounter: Payer: Self-pay | Admitting: Family Medicine

## 2019-12-04 LAB — CYTOLOGY - PAP
Adequacy: ABSENT
Chlamydia: NEGATIVE
Comment: NEGATIVE
Comment: NEGATIVE
Comment: NORMAL
Diagnosis: NEGATIVE
Neisseria Gonorrhea: NEGATIVE
Trichomonas: NEGATIVE

## 2019-12-04 LAB — SPECIMEN STATUS REPORT

## 2019-12-04 LAB — BETA HCG QUANT (REF LAB): hCG Quant: <1 m[IU]/mL

## 2019-12-10 ENCOUNTER — Other Ambulatory Visit: Payer: Self-pay | Admitting: Family Medicine

## 2019-12-10 DIAGNOSIS — F5101 Primary insomnia: Secondary | ICD-10-CM

## 2020-01-28 IMAGING — DX DG ABDOMEN 2V
2 series · 2 of 2 positions shown · non-contrast
Comparison: None.

CLINICAL DATA: 22-year-old female with vomiting and diarrhea

EXAM:
ABDOMEN - 2 VIEW

[abdomen supine (1 of 2)]
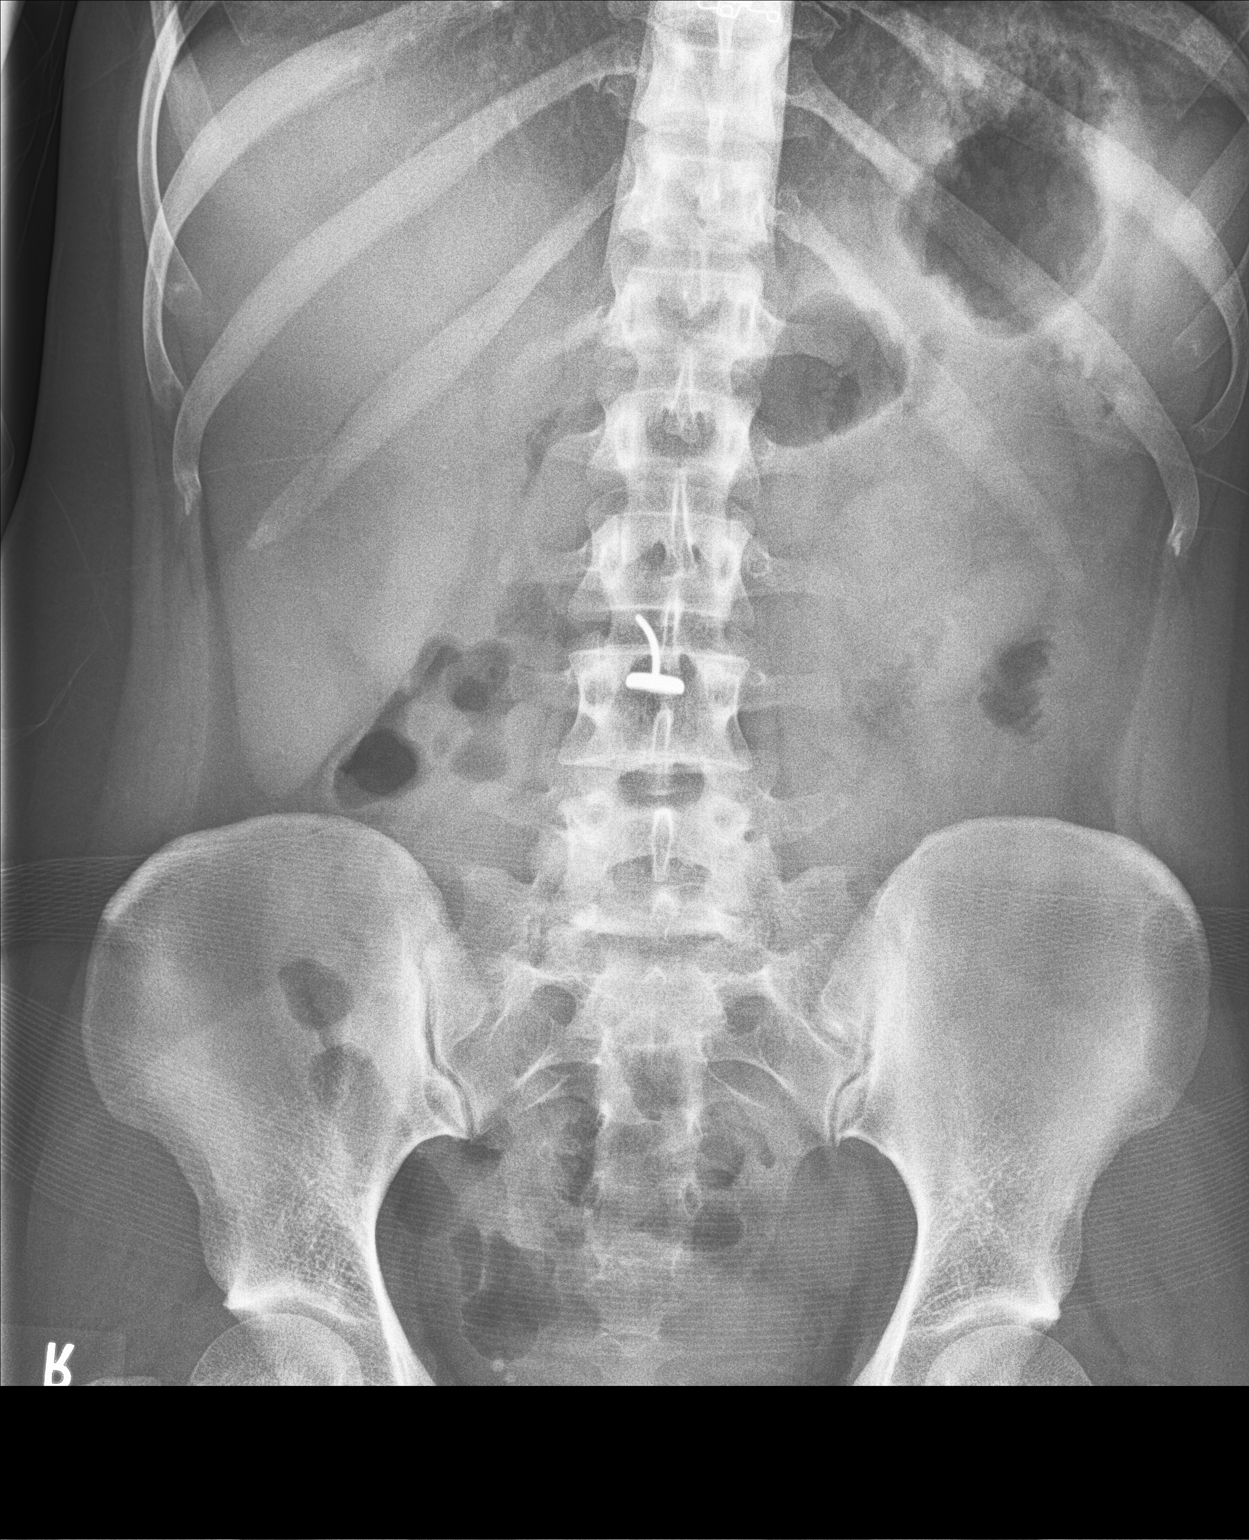

[abdomen supine (2 of 2)]
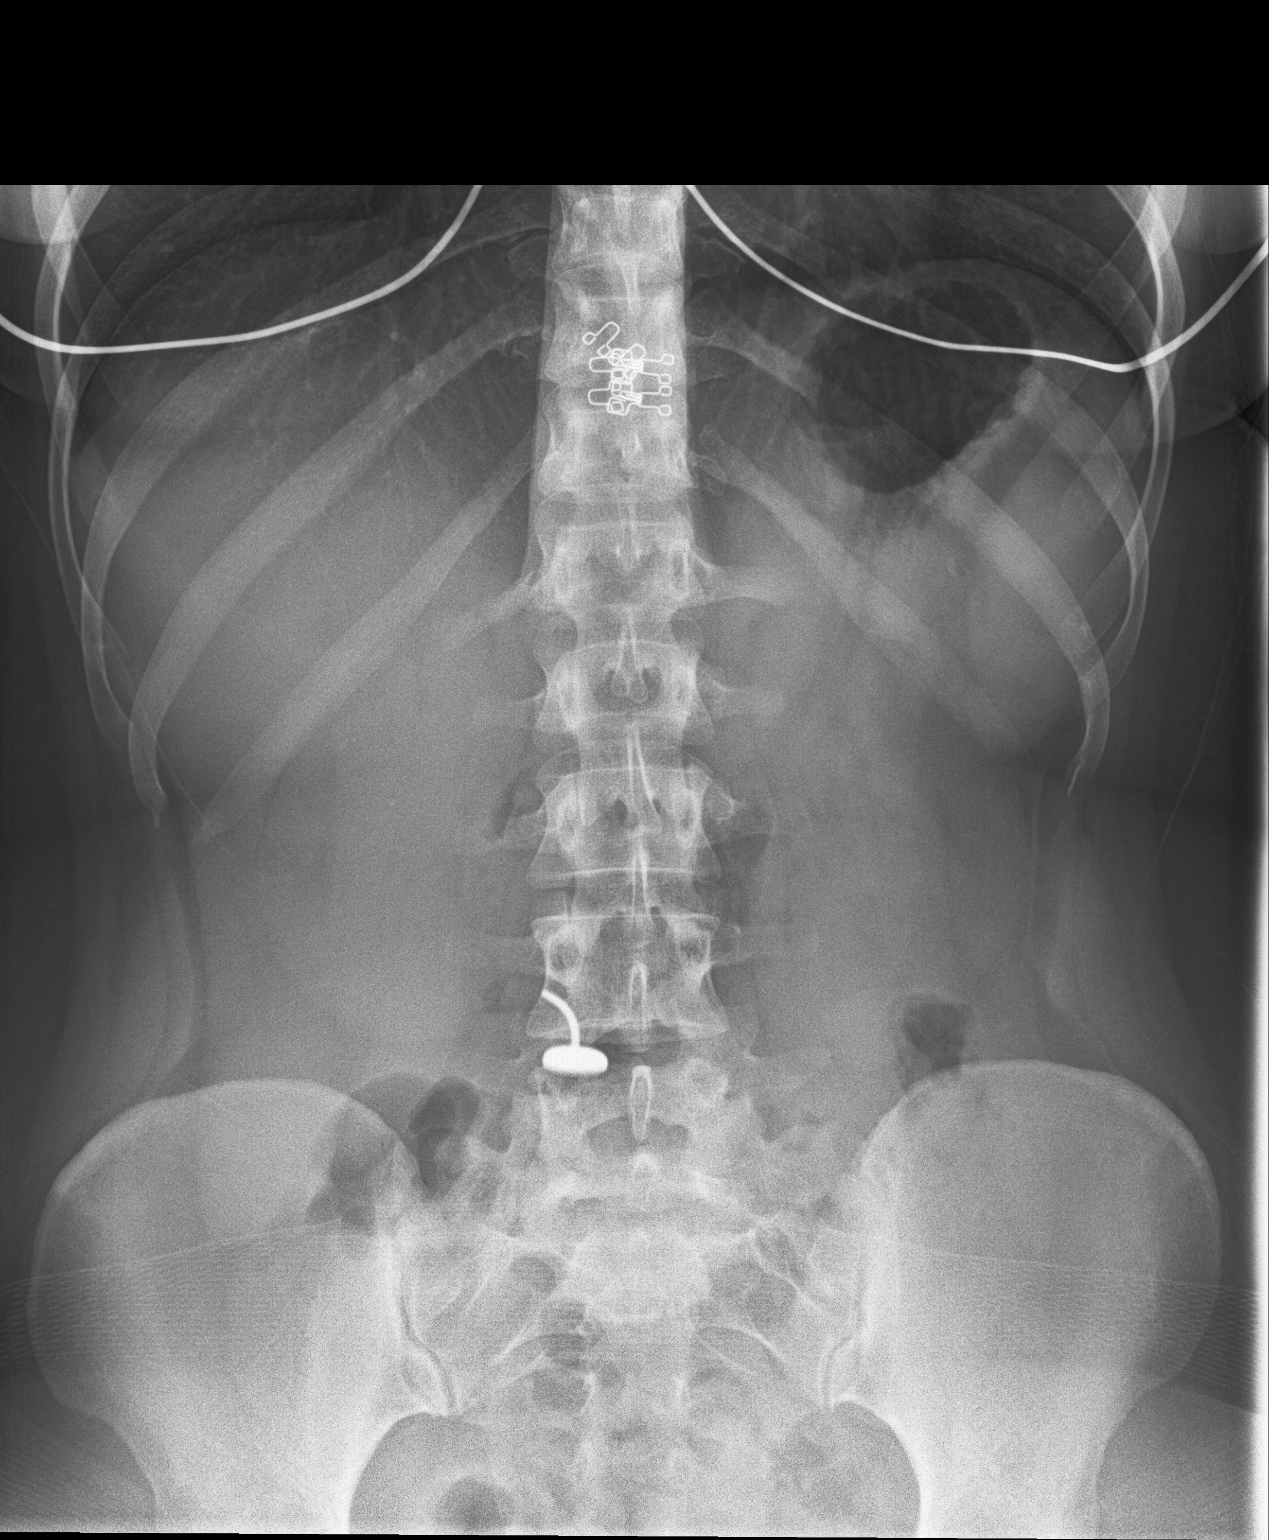

[2 of 2 positions shown; findings below may reference images not displayed]

FINDINGS: The bowel gas pattern is normal. No large free air on this supine
radiograph. A punctate radiopacity projects over the interpolar
region of the right renal shadow likely representing
nephrolithiasis. A metallic object projects over the mid to lower
abdomen likely reflecting a personal umbilical adornment. No
organomegaly. Lung bases are clear. Osseous structures are intact
and unremarkable.
IMPRESSION: 1. Normal bowel gas pattern.
2. Suspect punctate right-sided nephrolithiasis.

## 2020-01-28 IMAGING — CT CT ABD-PELV W/ CM
2 of 4 series · 16 of 46 positions shown, 18 images · IV contrast (Isovue)
Comparison: Prior radiograph from earlier the same day.

CLINICAL DATA: Initial evaluation for acute generalized abdominal
pain, nausea, vomiting, diarrhea.

EXAM:
CT ABDOMEN AND PELVIS WITH CONTRAST
TECHNIQUE: Multidetector CT imaging of the abdomen and pelvis was performed
using the standard protocol following bolus administration of
intravenous contrast.
CONTRAST:  100mL KO3BY6-3NN IOPAMIDOL (KO3BY6-3NN) INJECTION 61%,
30mL KO3BY6-3NN IOPAMIDOL (KO3BY6-3NN) INJECTION 61%

[Series 2: axial st · axial · 0.70mm/px · z∈[+884,+1299]mm · 13 of 91 slices shown, 15 images]
[im 4/91  soft-tissue]
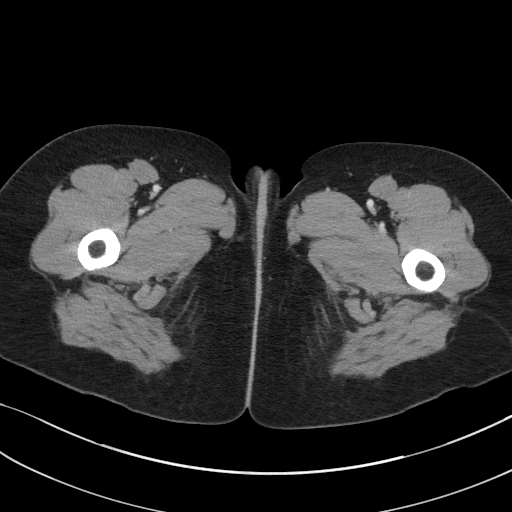
[im 4/91  bone]
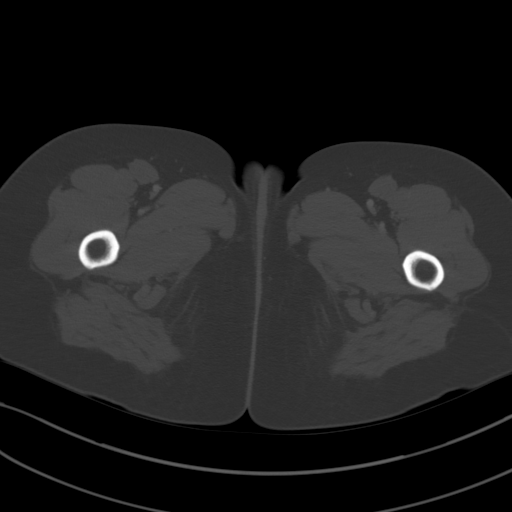
[im 11/91  soft-tissue]
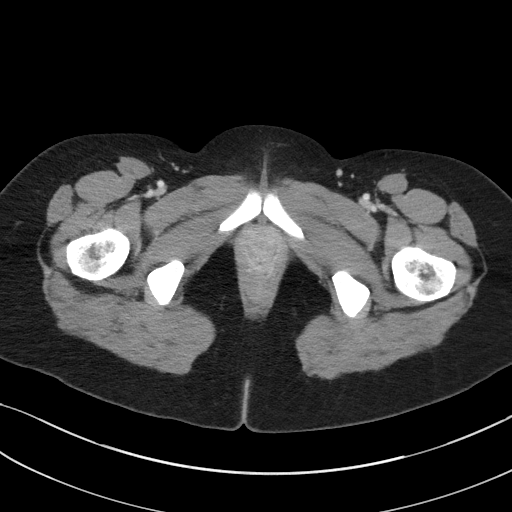
[im 19/91  soft-tissue]
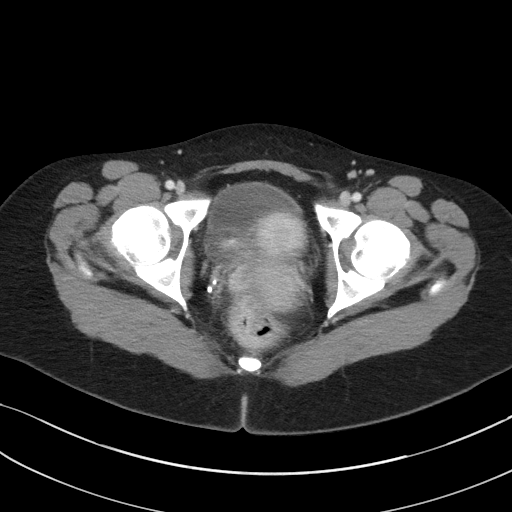
[im 26/91  soft-tissue]
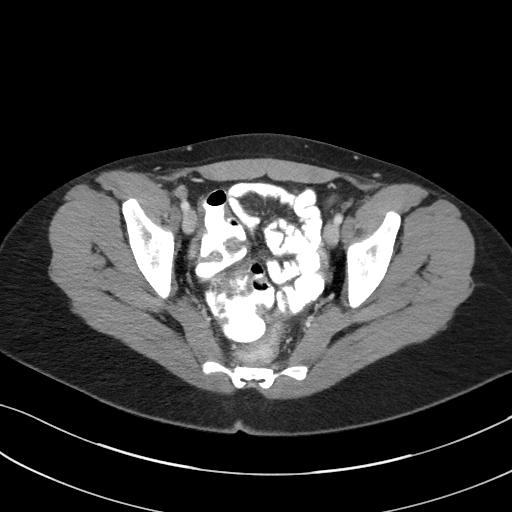
[im 33/91  soft-tissue]
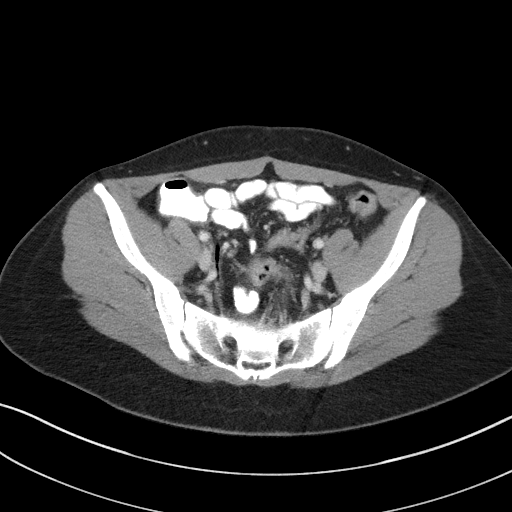
[im 40/91  soft-tissue]
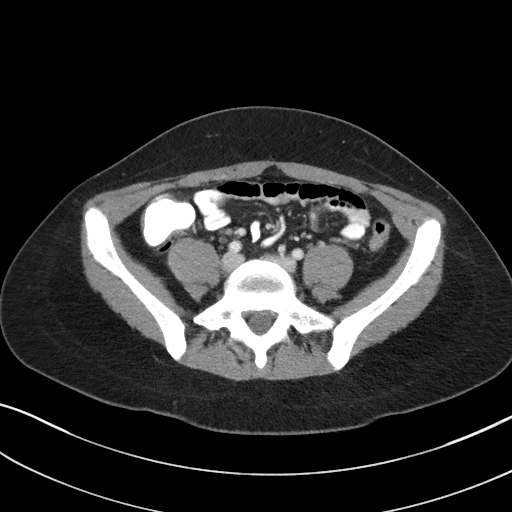
[im 47/91  soft-tissue]
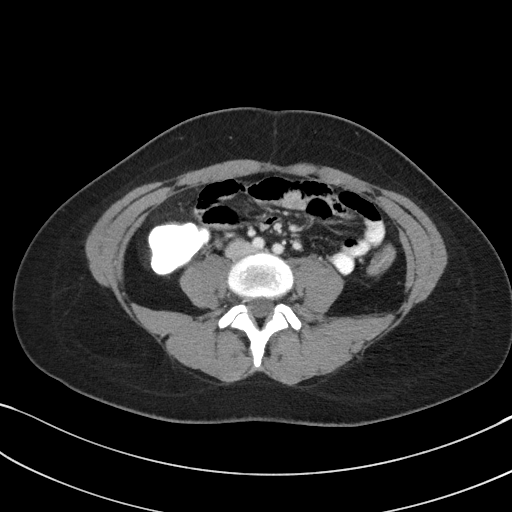
[im 51/91  soft-tissue]
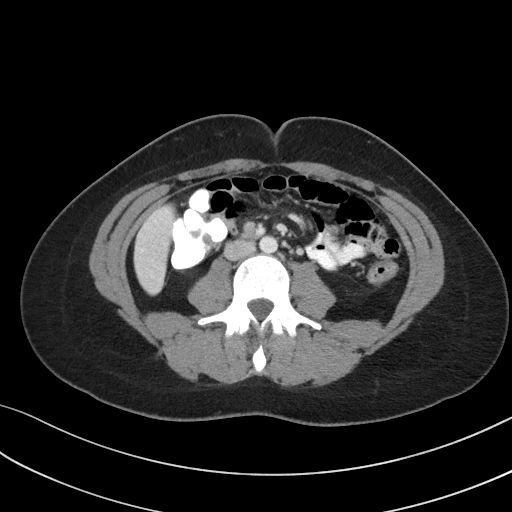
[im 58/91  soft-tissue]
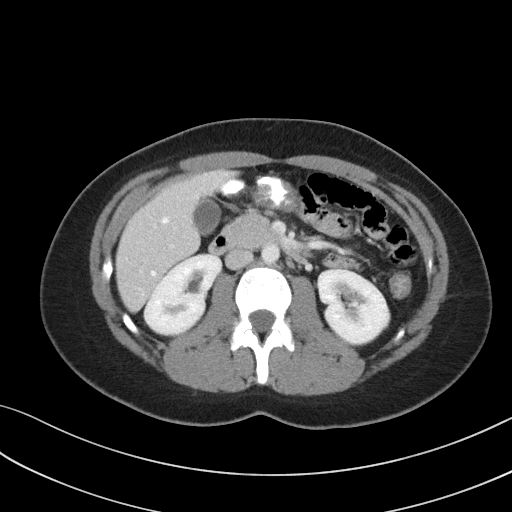
[im 58/91  bone]
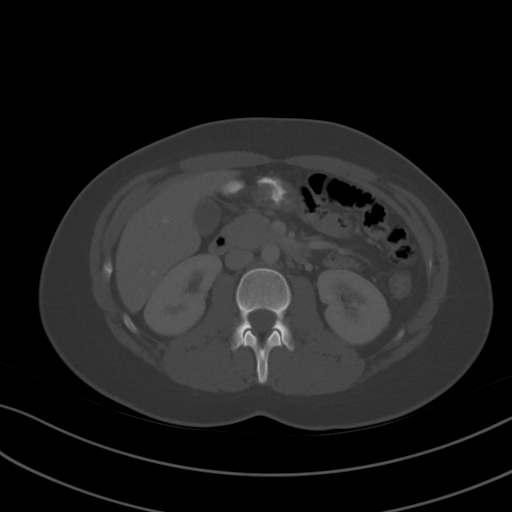
[im 65/91  soft-tissue]
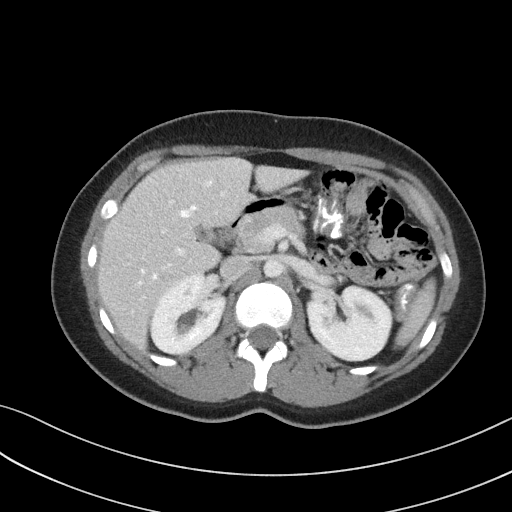
[im 73/91  soft-tissue]
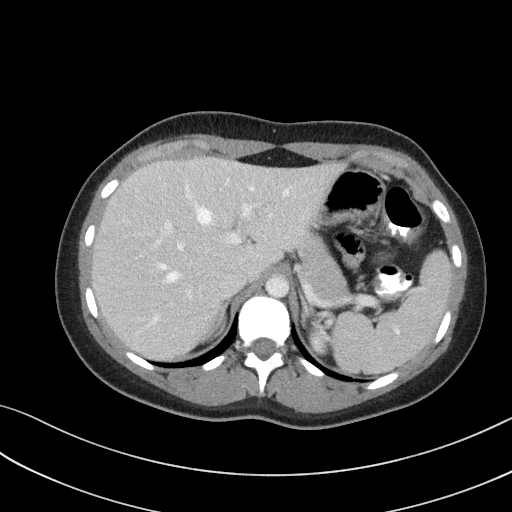
[im 80/91  soft-tissue]
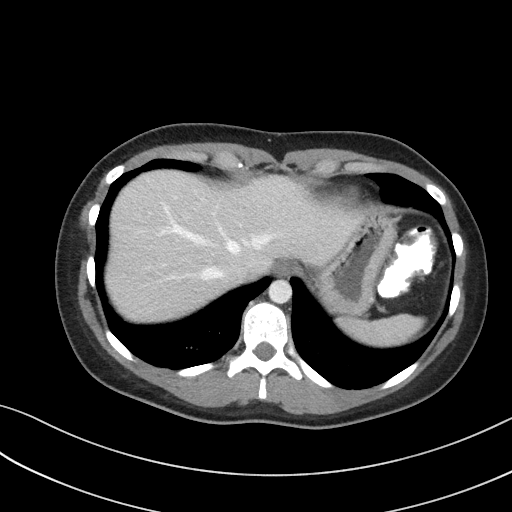
[im 87/91  soft-tissue]
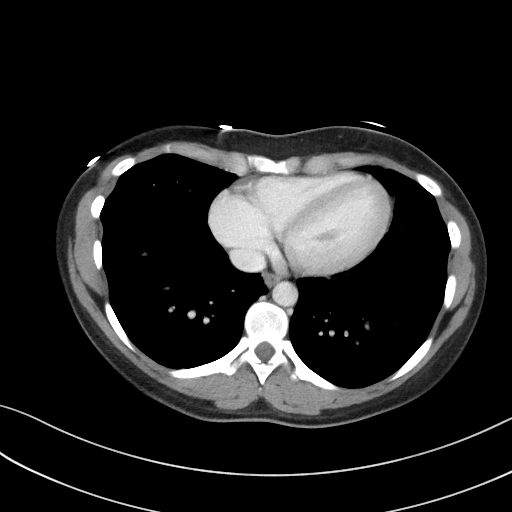

[Series 5: coronal st · coronal · 0.74mm/px · 3 of 85 slices shown]
[im 29/85  soft-tissue]
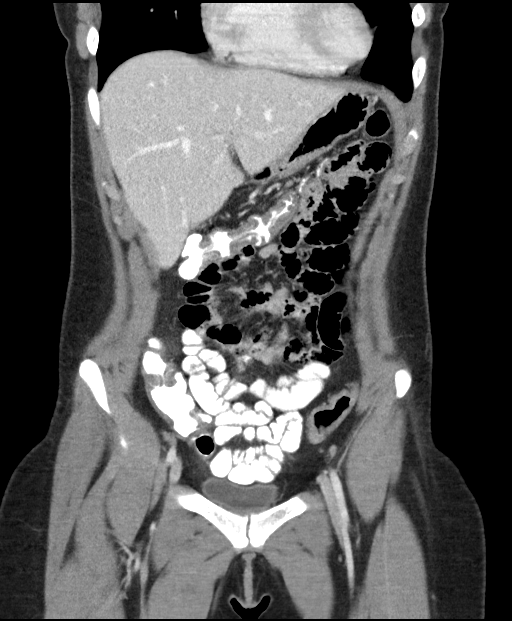
[im 38/85  soft-tissue]
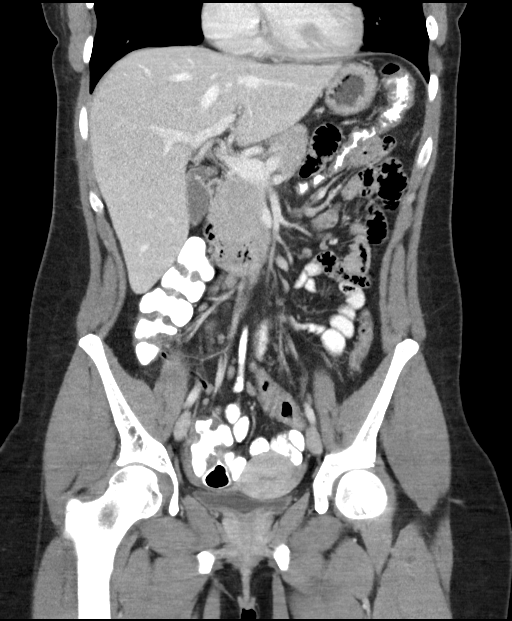
[im 47/85  soft-tissue]
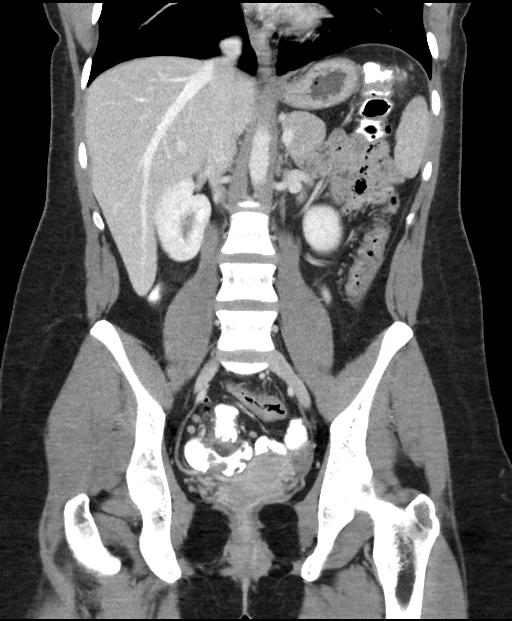

[16 of 46 positions shown; findings below may reference images not displayed]

FINDINGS: Lower chest: Minimal scattered subsegmental atelectatic changes
present within the visualized lung bases. Visualized lungs are
otherwise clear.

Hepatobiliary: Liver demonstrates a normal contrast enhanced
appearance. Gallbladder within normal limits. No biliary dilatation.

Pancreas: Pancreas within normal limits.

Spleen: Spleen within normal limits.

Adrenals/Urinary Tract: Adrenal glands are normal. Kidneys equal in
size with symmetric enhancement. Punctate 2 mm nonobstructive right
renal nephrolithiasis. No other radiopaque calculi. No
hydronephrosis or hydroureter. No focal enhancing renal mass.
Partially distended bladder within normal limits.

Stomach/Bowel: Stomach within normal limits. No evidence for bowel
obstruction. Appendix is normal. Scattered circumferential wall
thickening seen about the transverse, descending, and sigmoid colon,
suspicious for acute colitis given the provided history. No
complication identified.

Vascular/Lymphatic: Normal intravascular enhancement seen throughout
the intra-abdominal aorta. Mesenteric vessels patent proximally. No
adenopathy.

Reproductive: Uterus and ovaries within normal limits.

Other: No free air or fluid.

Musculoskeletal: No acute osseus abnormality. No worrisome lytic or
blastic osseous lesions.
IMPRESSION: 1. Circumferential wall thickening about the colon, suggesting acute
colitis. No complication identified. Finding could be either
infectious or inflammatory nature.
2. No other acute intra-abdominal or pelvic process. Normal
appendix.
3. 2 mm nonobstructive right renal nephrolithiasis.

## 2020-02-08 ENCOUNTER — Encounter: Payer: Self-pay | Admitting: Family Medicine

## 2020-02-08 ENCOUNTER — Ambulatory Visit (INDEPENDENT_AMBULATORY_CARE_PROVIDER_SITE_OTHER): Payer: 59 | Admitting: Family Medicine

## 2020-02-08 DIAGNOSIS — J0100 Acute maxillary sinusitis, unspecified: Secondary | ICD-10-CM

## 2020-02-08 MED ORDER — AZITHROMYCIN 250 MG PO TABS
ORAL_TABLET | ORAL | 0 refills | Status: DC
Start: 2020-02-08 — End: 2020-07-21

## 2020-02-08 NOTE — Progress Notes (Signed)
Subjective:    Patient ID: Sheryl Baxter, female    DOB: 03/11/1996, 24 y.o.   MRN: [id number]   HPI: Sheryl Baxter is a 24 y.o. female presenting for one week of nausea after eating. Then started having midline lower back and stomach cramps. Denies menses. No vaginal DC. Sore throat and rhinorrhea for the last two days. Having hot then cold spells. No cough or dyspnea.    Depression screen [facility] 2/9 11/30/2019 11/15/2018 06/17/2018 01/20/2018 11/30/2017  Decreased Interest 0 0 0 0 0  Down, Depressed, Hopeless 0 0 0 0 0  PHQ - 2 Score 0 0 0 0 0  Altered sleeping - 0 0 - -  Tired, decreased energy - 0 0 - -  Change in appetite - 0 0 - -  Feeling bad or failure about yourself  - 0 0 - -  Trouble concentrating - 0 0 - -  Moving slowly or fidgety/restless - 0 0 - -  Suicidal thoughts - 0 0 - -  PHQ-9 Score - 0 0 - -     Relevant past medical, surgical, family and social history reviewed and updated as indicated.  Interim medical history since our last visit reviewed. Allergies and medications reviewed and updated.  ROS:  Review of Systems  Constitutional: Negative for appetite change, chills, diaphoresis, fatigue and fever.  HENT: Positive for rhinorrhea and sore throat. Negative for congestion, ear pain, hearing loss, postnasal drip and trouble swallowing.   Respiratory: Negative for cough, chest tightness and shortness of breath.   Cardiovascular: Negative for chest pain and palpitations.  Gastrointestinal: Positive for abdominal pain. Negative for diarrhea.  Musculoskeletal: Positive for back pain. Negative for arthralgias.  Skin: Negative for rash.     Social History   Tobacco Use  Smoking Status Never Smoker  Smokeless Tobacco Never Used       Objective:     Wt Readings from Last 3 Encounters:  11/30/19 149 lb 3.2 oz (67.7 kg)  11/15/18 148 lb (67.1 kg)  06/17/18 149 lb (67.6 kg)     Exam deferred. Pt. Harboring due to COVID 19. Phone visit performed.    Assessment & Plan:   1. Acute maxillary sinusitis, recurrence not specified     Meds ordered this encounter  Medications  . azithromycin (ZITHROMAX Z-PAK) 250 MG tablet    Sig: Take two right away Then one a day for the next 4 days.    Dispense:  6 each    Refill:  0    No orders of the defined types were placed in this encounter.     Diagnoses and all orders for this visit:  Acute maxillary sinusitis, recurrence not specified  Other orders -     azithromycin (ZITHROMAX Z-PAK) 250 MG tablet; Take two right away Then one a day for the next 4 days.    Virtual Visit via telephone Note  I discussed the limitations, risks, security and privacy concerns of performing an evaluation and management service by telephone and the availability of in person appointments. The patient was identified with two identifiers. Pt.expressed understanding and agreed to proceed. Pt. Is at home. Dr. Livia Snellen is in his office.  Follow Up Instructions:   I discussed the assessment and treatment plan with the patient. The patient was provided an opportunity to ask questions and all were answered. The patient agreed with the plan and demonstrated an understanding of the instructions.   The patient was advised to call back or  seek an in-person evaluation if the symptoms worsen or if the condition fails to improve as anticipated.   Total minutes including chart review and phone contact time: 17   Follow up plan: Return if symptoms worsen or fail to improve.  Claretta Fraise, MD [place]

## 2020-04-22 ENCOUNTER — Other Ambulatory Visit: Payer: Self-pay

## 2020-04-22 ENCOUNTER — Ambulatory Visit (INDEPENDENT_AMBULATORY_CARE_PROVIDER_SITE_OTHER): Payer: 59 | Admitting: Family Medicine

## 2020-04-22 DIAGNOSIS — J0100 Acute maxillary sinusitis, unspecified: Secondary | ICD-10-CM

## 2020-04-22 MED ORDER — PSEUDOEPHEDRINE-GUAIFENESIN ER 120-1200 MG PO TB12
1.0000 | ORAL_TABLET | Freq: Two times a day (BID) | ORAL | 0 refills | Status: DC
Start: 2020-04-22 — End: 2020-07-21

## 2020-04-22 MED ORDER — AMOXICILLIN-POT CLAVULANATE 875-125 MG PO TABS: 1.0000 | ORAL_TABLET | Freq: Two times a day (BID) | ORAL | 0 refills | Status: DC

## 2020-04-22 NOTE — Progress Notes (Signed)
Subjective:    Patient ID: Sheryl Baxter, female    DOB: [birth year], 24 y.o.   MRN: [id number]   HPI: Sheryl Baxter is a 24 y.o. female presenting for Symptoms include congestion, facial pain, nasal congestion, non productive cough, post nasal drip and sinus pressure. There is no fever, chills, or sweats. Onset of symptoms was a few days ago, gradually worsening since that time. Mild ST. HA frontal and temples.    Depression screen [facility] 2/9 11/30/2019 11/15/2018 06/17/2018 01/20/2018 11/30/2017  Decreased Interest 0 0 0 0 0  Down, Depressed, Hopeless 0 0 0 0 0  PHQ - 2 Score 0 0 0 0 0  Altered sleeping - 0 0 - -  Tired, decreased energy - 0 0 - -  Change in appetite - 0 0 - -  Feeling bad or failure about yourself  - 0 0 - -  Trouble concentrating - 0 0 - -  Moving slowly or fidgety/restless - 0 0 - -  Suicidal thoughts - 0 0 - -  PHQ-9 Score - 0 0 - -     Relevant past medical, surgical, family and social history reviewed and updated as indicated.  Interim medical history since our last visit reviewed. Allergies and medications reviewed and updated.  ROS:  Review of Systems  Constitutional: Negative.   HENT: Negative.   Eyes: Negative for visual disturbance.  Respiratory: Negative for shortness of breath.   Cardiovascular: Negative for chest pain.  Gastrointestinal: Negative for abdominal pain.  Musculoskeletal: Negative for arthralgias.     Social History   Tobacco Use  Smoking Status Never Smoker  Smokeless Tobacco Never Used       Objective:     Wt Readings from Last 3 Encounters:  11/30/19 149 lb 3.2 oz (67.7 kg)  11/15/18 148 lb (67.1 kg)  06/17/18 149 lb (67.6 kg)     Exam deferred. Pt. Harboring due to COVID 19. Phone visit performed.   Assessment & Plan:   1. Acute maxillary sinusitis, recurrence not specified     Meds ordered this encounter  Medications  . amoxicillin-clavulanate (AUGMENTIN) 875-125 MG tablet    Sig: Take 1 tablet by mouth  2 (two) times daily. Take all of this medication    Dispense:  20 tablet    Refill:  0  . Pseudoephedrine-Guaifenesin [phone] MG TB12    Sig: Take 1 tablet by mouth 2 (two) times daily. For congestion    Dispense:  12 tablet    Refill:  0    No orders of the defined types were placed in this encounter.     Diagnoses and all orders for this visit:  Acute maxillary sinusitis, recurrence not specified  Other orders -     amoxicillin-clavulanate (AUGMENTIN) 875-125 MG tablet; Take 1 tablet by mouth 2 (two) times daily. Take all of this medication -     Pseudoephedrine-Guaifenesin [phone] MG TB12; Take 1 tablet by mouth 2 (two) times daily. For congestion    Virtual Visit via telephone Note  I discussed the limitations, risks, security and privacy concerns of performing an evaluation and management service by telephone and the availability of in person appointments. The patient was identified with two identifiers. Pt.expressed understanding and agreed to proceed. Pt. Is at home. Dr. Livia Snellen is in his office.  Follow Up Instructions:   I discussed the assessment and treatment plan with the patient. The patient was provided an opportunity to ask questions and all were answered. The  patient agreed with the plan and demonstrated an understanding of the instructions.   The patient was advised to call back or seek an in-person evaluation if the symptoms worsen or if the condition fails to improve as anticipated.   Total minutes including chart review and phone contact time: 16   Follow up plan: Return if symptoms worsen or fail to improve.  Claretta Fraise, MD [place]

## 2020-04-23 ENCOUNTER — Telehealth: Payer: Self-pay

## 2020-04-23 MED ORDER — FLUCONAZOLE 150 MG PO TABS
150.0000 mg | ORAL_TABLET | Freq: Once | ORAL | 0 refills | Status: AC
Start: 2020-04-23 — End: 2020-04-23

## 2020-04-23 NOTE — Telephone Encounter (Signed)
Left message that requested rx has been sent to the pharmacy.

## 2020-04-23 NOTE — Telephone Encounter (Signed)
Diflucan sent to [facility] pharmacy

## 2020-04-23 NOTE — Telephone Encounter (Signed)
Patient was given Augmentin on 11/30 and states abx give her yeast infections. Requesting rx- please advise.  Covering PCP

## 2020-04-24 ENCOUNTER — Encounter: Payer: Self-pay | Admitting: Family Medicine

## 2020-07-21 ENCOUNTER — Encounter: Payer: Self-pay | Admitting: Nurse Practitioner

## 2020-07-21 ENCOUNTER — Ambulatory Visit (INDEPENDENT_AMBULATORY_CARE_PROVIDER_SITE_OTHER): Payer: 59 | Admitting: Nurse Practitioner

## 2020-07-21 DIAGNOSIS — J029 Acute pharyngitis, unspecified: Secondary | ICD-10-CM | POA: Diagnosis not present

## 2020-07-21 MED ORDER — DM-GUAIFENESIN ER 30-600 MG PO TB12: 1.0000 | ORAL_TABLET | Freq: Two times a day (BID) | ORAL | 0 refills | Status: DC

## 2020-07-21 MED ORDER — PREDNISONE 10 MG (21) PO TBPK: ORAL_TABLET | ORAL | 0 refills | Status: DC

## 2020-07-21 MED ORDER — AZITHROMYCIN 250 MG PO TABS: ORAL_TABLET | ORAL | 0 refills | Status: DC

## 2020-07-21 NOTE — Assessment & Plan Note (Signed)
Worsening signs and symptoms of pharyngitis.  Patient is reporting worsening sore throat, cough, chest congestion/chest pressure, in the last 7 days.  No fever, nausea or vomiting.  Patient tested positive for COVID-19 about a month ago. Advised patient to come in and take a Covid spot in clinic.  Patient reports she is currently working and would repeat a COVID-19 swab at a clinic close by home and will let PCP know if she is Covid positive again.  Encourage patient to use Tylenol for fever if it occurs/pain, started patient on Z-Pak, prednisone and guaifenesin for cough and congestion.  Rx sent to pharmacy  Follow-up for worsening unresolved symptoms.

## 2020-07-21 NOTE — Progress Notes (Signed)
Virtual Visit via telephone Note Due to COVID-19 pandemic this visit was conducted virtually. This visit type was conducted due to national recommendations for restrictions regarding the COVID-19 Pandemic (e.g. social distancing, sheltering in place) in an effort to limit this patient's exposure and mitigate transmission in our community. All issues noted in this document were discussed and addressed.  A physical exam was not performed with this format.  I connected with Sheryl Baxter on 07/21/20 at  1:40 PM by telephone and verified that I am speaking with the correct person using two identifiers. Sheryl Baxter is currently located at work during visit. The provider, Ivy Lynn, NP is located in their office at time of visit.  I discussed the limitations, risks, security and privacy concerns of performing an evaluation and management service by telephone and the availability of in person appointments. I also discussed with the patient that there may be a patient responsible charge related to this service. The patient expressed understanding and agreed to proceed.   History and Present Illness:  Sore Throat  This is a recurrent problem. The current episode started in the past 7 days. The problem has been gradually worsening. Neither side of throat is experiencing more pain than the other. The maximum temperature recorded prior to her arrival was 100.4 - 100.9 F. The fever has been present for less than 1 day. The pain is moderate. Associated symptoms include congestion, coughing and headaches. She has tried acetaminophen for the symptoms.      Review of Systems  Constitutional: Negative for chills and fever.  HENT: Positive for congestion and sore throat.   Respiratory: Positive for cough.   Cardiovascular: Negative.   Gastrointestinal: Negative.   Genitourinary: Negative.   Musculoskeletal: Negative.   Neurological: Positive for headaches.  All other systems reviewed and are  negative.    Observations/Objective: Televisit-patient did not sound to be in distress.  Assessment and Plan: Acute pharyngitis Worsening signs and symptoms of pharyngitis.  Patient is reporting worsening sore throat, cough, chest congestion/chest pressure, in the last 7 days.  No fever, nausea or vomiting.  Patient tested positive for COVID-19 about a month ago. Advised patient to come in and take a Covid spot in clinic.  Patient reports she is currently working and would repeat a COVID-19 swab at a clinic close by home and will let PCP know if she is Covid positive again.  Encourage patient to use Tylenol for fever if it occurs/pain, started patient on Z-Pak, prednisone and guaifenesin for cough and congestion.  Rx sent to pharmacy  Follow-up for worsening unresolved symptoms.   Follow Up Instructions: Follow-up with unresolved symptoms.    I discussed the assessment and treatment plan with the patient. The patient was provided an opportunity to ask questions and all were answered. The patient agreed with the plan and demonstrated an understanding of the instructions.   The patient was advised to call back or seek an in-person evaluation if the symptoms worsen or if the condition fails to improve as anticipated.  The above assessment and management plan was discussed with the patient. The patient verbalized understanding of and has agreed to the management plan. Patient is aware to call the clinic if symptoms persist or worsen. Patient is aware when to return to the clinic for a follow-up visit. Patient educated on when it is appropriate to go to the emergency department.   Time call ended: 1:50 PM I provided 10 minutes of non-face-to-face time during this encounter.  Ivy Lynn, NP

## 2020-07-21 NOTE — Patient Instructions (Signed)

## 2020-07-23 ENCOUNTER — Other Ambulatory Visit: Payer: Self-pay | Admitting: Nurse Practitioner

## 2020-07-23 ENCOUNTER — Telehealth: Payer: Self-pay

## 2020-07-23 MED ORDER — FLUCONAZOLE 150 MG PO TABS
150.0000 mg | ORAL_TABLET | Freq: Once | ORAL | 0 refills | Status: AC
Start: 2020-07-23 — End: 2020-07-23

## 2020-07-23 NOTE — Telephone Encounter (Signed)
Diflucan sent to pharmacy.

## 2020-07-23 NOTE — Progress Notes (Signed)
diflucan

## 2020-07-24 NOTE — Telephone Encounter (Signed)
Called patient, no answer, left detailed voice message

## 2020-08-22 ENCOUNTER — Encounter: Payer: Self-pay | Admitting: Nurse Practitioner

## 2020-08-22 ENCOUNTER — Ambulatory Visit (INDEPENDENT_AMBULATORY_CARE_PROVIDER_SITE_OTHER): Payer: 59 | Admitting: Nurse Practitioner

## 2020-08-22 ENCOUNTER — Other Ambulatory Visit: Payer: Self-pay

## 2020-08-22 VITALS — BP 108/60 | HR 72 | Temp 97.4°F | Ht 62.0 in | Wt 148.0 lb

## 2020-08-22 DIAGNOSIS — M25531 Pain in right wrist: Secondary | ICD-10-CM | POA: Diagnosis not present

## 2020-08-22 DIAGNOSIS — S6991XA Unspecified injury of right wrist, hand and finger(s), initial encounter: Secondary | ICD-10-CM | POA: Insufficient documentation

## 2020-08-22 MED ORDER — IBUPROFEN 600 MG PO TABS: 600.0000 mg | ORAL_TABLET | Freq: Three times a day (TID) | ORAL | 0 refills | Status: DC | PRN

## 2020-08-22 MED ORDER — IBUPROFEN 600 MG PO TABS: 600.0000 mg | ORAL_TABLET | Freq: Three times a day (TID) | ORAL | 0 refills | Status: AC | PRN

## 2020-08-22 MED ORDER — METHYLPREDNISOLONE ACETATE 40 MG/ML IJ SUSP
80.0000 mg | Freq: Once | INTRAMUSCULAR | Status: AC
  Administered 2020-08-22: 80 mg via INTRAMUSCULAR

## 2020-08-22 NOTE — Progress Notes (Signed)
Acute Office Visit  Subjective:    Patient ID: Sheryl Baxter, female    DOB: 30-Jan-1996, 25 y.o.   MRN: [id number]  Chief Complaint  Patient presents with  . Wrist Pain    Wrist Pain  The pain is present in the right hand. This is a new problem. The current episode started yesterday. The problem occurs constantly. The problem has been unchanged. The pain is moderate. Associated symptoms include a limited range of motion, numbness and tingling. Pertinent negatives include no fever. The symptoms are aggravated by activity. There is no history of rheumatoid arthritis.     Past Medical History:  Diagnosis Date  . UTI (urinary tract infection) 03/30/2017    History reviewed. No pertinent surgical history.  Family History  Problem Relation Age of Onset  . GER disease Mother   . Heart murmur Mother   . GER disease Father   . Sleep apnea Father   . Depression Maternal Grandmother   . Bipolar disorder Maternal Grandmother   . Osteoporosis Maternal Grandmother   . GER disease Maternal Grandmother   . Heart disease Maternal Grandfather   . Cancer Paternal Grandmother        Esophagus cancer  . Heart disease Paternal Grandfather     Social History   Socioeconomic History  . Marital status: Single    Spouse name: Not on file  . Number of children: Not on file  . Years of education: Not on file  . Highest education level: Not on file  Occupational History  . Not on file  Tobacco Use  . Smoking status: Never Smoker  . Smokeless tobacco: Never Used  Vaping Use  . Vaping Use: Never used  Substance and Sexual Activity  . Alcohol use: No  . Drug use: No  . Sexual activity: Not Currently  Other Topics Concern  . Not on file  Social History Narrative  . Not on file   Social Determinants of Health   Financial Resource Strain: Not on file  Food Insecurity: Not on file  Transportation Needs: Not on file  Physical Activity: Not on file  Stress: Not on file  Social  Connections: Not on file  Intimate Partner Violence: Not on file    Outpatient Medications Prior to Visit  Medication Sig Dispense Refill  . Norgestimate-Ethinyl Estradiol Triphasic (ORTHO TRI-CYCLEN, 28,) 0.18/0.215/0.25 MG-35 MCG tablet Take 1 tablet by mouth daily. 3 Package 4  . azithromycin (ZITHROMAX) 250 MG tablet 2 tablets day 1, 1 tablet day 2-5. 1 each 0  . dextromethorphan-guaiFENesin (MUCINEX DM) 30-600 MG 12hr tablet Take 1 tablet by mouth 2 (two) times daily. 30 tablet 0  . pantoprazole (PROTONIX) 40 MG tablet Take 1 tablet (40 mg total) by mouth daily for 14 days. 14 tablet 0  . predniSONE (STERAPRED UNI-PAK 21 TAB) 10 MG (21) TBPK tablet 6 tablets day 1, 5 tablets day 2, 4 tablet day 3, 3 tablets day 4, 2 tablet day 5, 1 tablet day 6. 21 tablet 0  . traZODone (DESYREL) 50 MG tablet TAKE 0.5-2 TABLETS (25-100 MG TOTAL) BY MOUTH AT BEDTIME AS NEEDED FOR SLEEP. 180 tablet 0   No facility-administered medications prior to visit.    Allergies  Allergen Reactions  . Zyrtec [Cetirizine]     Dry eye    Review of Systems  Constitutional: Negative for fever.  HENT: Negative.   Respiratory: Negative.   Cardiovascular: Negative.   Gastrointestinal: Negative.   Musculoskeletal: Positive for joint swelling.  Skin: Negative.   Neurological: Positive for tingling and numbness.  All other systems reviewed and are negative.      Objective:    Physical Exam Constitutional:      Appearance: Normal appearance.  HENT:     Head: Normocephalic.     Nose: Nose normal.     Mouth/Throat:     Mouth: Mucous membranes are moist.  Eyes:     Conjunctiva/sclera: Conjunctivae normal.  Cardiovascular:     Rate and Rhythm: Normal rate and regular rhythm.     Pulses: Normal pulses.     Heart sounds: Normal heart sounds.  Pulmonary:     Effort: Pulmonary effort is normal.     Breath sounds: Normal breath sounds.  Abdominal:     General: Bowel sounds are normal.  Musculoskeletal:         General: Tenderness present.  Skin:    General: Skin is warm.  Neurological:     Mental Status: She is alert and oriented to person, place, and time.  Psychiatric:        Behavior: Behavior normal.     BP 108/60   Pulse 72   Temp (!) 97.4 F (36.3 C) (Temporal)   Ht 5' 2"  (1.575 m)   Wt 148 lb (67.1 kg)   BMI 27.07 kg/m  Wt Readings from Last 3 Encounters:  08/22/20 148 lb (67.1 kg)  11/30/19 149 lb 3.2 oz (67.7 kg)  11/15/18 148 lb (67.1 kg)    Health Maintenance Due  Topic Date Due  . COVID-19 Vaccine (2 - Moderna 3-dose series) 06/28/2020    There are no preventive care reminders to display for this patient.   Lab Results  Component Value Date   TSH 1.640 11/30/2019   Lab Results  Component Value Date   WBC 6.0 11/30/2019   HGB 14.0 11/30/2019   HCT 42.6 11/30/2019   MCV 88 11/30/2019   PLT 281 11/30/2019   Lab Results  Component Value Date   NA 135 11/30/2019   K 4.0 11/30/2019   CO2 21 11/30/2019   GLUCOSE 92 11/30/2019   BUN 8 11/30/2019   CREATININE 0.63 11/30/2019   BILITOT 0.5 11/30/2019   ALKPHOS 48 11/30/2019   AST 17 11/30/2019   ALT 14 11/30/2019   PROT 6.6 11/30/2019   ALBUMIN 4.3 11/30/2019   CALCIUM 9.3 11/30/2019       Assessment & Plan:   Problem List Items Addressed This Visit      Other   Injury of right wrist - Primary    Patient had a fall in the last 24 hours breaking the fall with her right wrist and landed palms down.  Patient is experiencing moderate to severe pain mild tingling and numbness.  Patient does not want an x-ray today.  On assessment patient has limited range of motion and reports pain 5-7 out of 10.  Education provided to patient with printed handouts given.  Depo-Medrol shot given, advised patient to start ibuprofen, ice pack, wrist brace.  Return to work without lifting or twisting.  Follow-up with worsening unresolved symptoms.   Rx sent to pharmacy.          Meds ordered this encounter   Medications  . DISCONTD: ibuprofen (ADVIL) 600 MG tablet    Sig: Take 1 tablet (600 mg total) by mouth every 8 (eight) hours as needed.    Dispense:  30 tablet    Refill:  0    Order Specific Question:  Supervising Provider    Answer:   Janora Norlander [[id number]]  . ibuprofen (ADVIL) 600 MG tablet    Sig: Take 1 tablet (600 mg total) by mouth every 8 (eight) hours as needed.    Dispense:  30 tablet    Refill:  0    Order Specific Question:   Supervising Provider    Answer:   Janora Norlander [[id number]]     Ivy Lynn, NP

## 2020-08-22 NOTE — Addendum Note (Signed)
Addended byCarrolyn Leigh on: 08/22/2020 03:15 PM   Modules accepted: Orders

## 2020-08-22 NOTE — Patient Instructions (Addendum)
Depo-Medrol 80 mg for pain, ibuprofen every 8 hours, wrist brace, ice application.  Follow-up with worsening or unresolved symptoms.  Wrist Pain, Adult There are many things that can cause wrist pain. Some common causes include:  An injury to the wrist.  Using the joint too much.  A condition that causes too much pressure to be put on a nerve in the wrist (carpal tunnel syndrome).  Wear and tear of the joints that happens as a person gets older (osteoarthritis).  A condition that causes swelling and stiffness in the joints (arthritis). Sometimes, the cause of wrist pain is not known. Often, the pain goes away when you follow your doctor's instructions for easing pain at home. This may include resting your wrist, icing your wrist, or using a splint or an elastic wrap for a short time. It is important to tell your doctor if your wrist pain does not go away. Follow these instructions at home: If you have a splint or elastic wrap:  Wear the splint or wrap as told by your doctor. Take it off only as told by your doctor. Ask if you can take it off for bathing.  Loosen the splint or wrap if your fingers: ? Tingle. ? Become numb. ? Turn cold and blue.  Check the skin around the splint or wrap every day. Tell your doctor about any concerns.  Keep the splint or wrap clean.  If the splint or wrap is not waterproof: ? Do not let it get wet. ? Cover it with a watertight covering when you take a bath or shower. Managing pain, stiffness, and swelling  If told, put ice on the painful area. To do this: ? If you have a removable splint or wrap, take it off as told by your doctor. ? Put ice in a plastic bag. ? Place a towel between your skin and the bag or between your splint or wrap and the bag. ? Leave the ice on for 20 minutes, 2-3 times a day.  Move your fingers often.  Raise (elevate) the injured area above the level of your heart while you are sitting or lying down.   Activity  Rest  your wrist as told by your doctor.  Return to your normal activities as told by your doctor. Ask your doctor what activities are safe for you.  Ask your doctor when it is safe to drive if you have a splint or wrap on your wrist.  Do exercises as told by your doctor. General instructions  Pay attention to any changes in your symptoms.  Take over-the-counter and prescription medicines only as told by your doctor.  Keep all follow-up visits as told by your doctor. This is important. Contact a doctor if:  You have a sudden, sharp pain in the wrist, hand, or arm that is different or new.  The swelling or bruising on your wrist or hand gets worse.  Your skin: ? Becomes red. ? Gets a rash. ? Has open sores.  Your pain does not get better.  Your pain gets worse.  You have a fever or chills. Get help right away if:  You lose feeling in your fingers or hand.  Your fingers turn white, very red, or cold and blue.  You cannot move your fingers. Summary  There are many things that can cause wrist pain.  It is important to tell your doctor if your wrist pain does not go away.  You may need to wear a splint or a  wrap for a short period of time.  Return to your normal activities as told by your doctor. Ask your doctor what activities are safe for you. This information is not intended to replace advice given to you by your health care provider. Make sure you discuss any questions you have with your health care provider. Document Revised: 03/29/2019 Document Reviewed: 03/29/2019 Elsevier Patient Education  2021 [employer].  Wrist Sprain, Adult A wrist sprain is a stretch or tear in the strong tissues that connect the wrist bones to each other. These strong tissues are called ligaments. There are three types of wrist sprains: Grade 1. The ligament is stretched more than normal. There may be a minor amount of wrist pain. Grade 2. The ligament is partially torn. You may be able to  move your wrist, but not very much. There may be a moderate amount of wrist pain. Grade 3. The ligament or ligaments are completely torn. You may find it difficult to move your wrist even a little. There may be a significant amount of wrist pain. What are the causes? This condition may be caused by using the wrist too much during sports, exercise, or work. It can also happen due to a fall or during an accident. What increases the risk? You are more likely to develop this condition if: You had a previous wrist or arm injury. You have poor wrist strength and flexibility. You play contact sports, such as football or soccer. You participate in sports that may result in a fall, such as skateboarding, biking, skiing, or snowboarding. You do not exercise regularly. You use exercise equipment that does not fit well. What are the signs or symptoms? Symptoms of this condition include: Pain in the wrist, arm, or hand. Swelling or bruised skin near the wrist, hand, or arm. The skin may look yellow or blue. Stiffness or trouble moving the hand. Hearing a noise, like a pop or a snap, at the time of injury, or feeling a tear at the time of the injury. A warm feeling in the skin around the wrist. How is this diagnosed? This condition is diagnosed with a physical exam. Sometimes an X-ray is taken to make sure a bone did not break. You may also have an MRI of your wrist to check for torn ligaments. How is this treated? This condition is treated by resting and applying ice to your wrist. Additional treatment may include: Taking medicine for pain and inflammation. Wearing a splint, brace, or cast for a short period of time to keep your wrist from moving (immobilized). Doing exercises to strengthen and stretch your wrist. Having surgery. This may be done if the ligament is completely torn. Follow these instructions at home: If you have a splint or brace: Wear the splint or brace as told by your health care  provider. Remove it only as told by your health care provider. Loosen it if your fingers tingle, become numb, or turn cold and blue. Keep it clean. If the splint or brace is not waterproof: Do not let it get wet. Cover it with a watertight covering when you take a bath or a shower. If you have a cast: Do not put pressure on any part of the cast until it is fully hardened. This may take several hours. Do not stick anything inside the cast to scratch your skin. Doing that increases your risk of infection. Check the skin around the cast every day. Tell your health care provider about any concerns. You  may put lotion on dry skin around the edges of the cast. Do not put lotion on the skin underneath the cast. Keep it clean. If the cast is not waterproof: Do not let it get wet. Cover it with a watertight covering when you take a bath or shower. Managing pain, stiffness, and swelling If directed, put ice on the injured area. To do this: If you have a removable splint or brace, remove it as told by your health care provider. Put ice in a plastic bag. Place a towel between your skin and the bag or between the splint or cast and the bag. Leave the ice on for 20 minutes, 2-3 times a day. Remove the ice if your skin turns bright red. This is very important. If you cannot feel pain, heat, or cold, you have a greater risk of damage to the area. Move your fingers often to reduce stiffness and swelling. Raise (elevate) the injured area above the level of your heart while you are sitting or lying down.   Activity Rest your wrist as told by your health care provider. Do not do things that cause pain. Ask your health care provider when it is safe to drive if you have a splint, brace, or cast on your wrist. Do exercises as told by your health care provider. Return to your normal activities as told by your health care provider. Ask your health care provider what activities are safe for you. General  instructions Take over-the-counter and prescription medicines only as told by your health care provider. Do not use any products that contain nicotine or tobacco, such as cigarettes, e-cigarettes, and chewing tobacco. These can delay healing. If you need help quitting, ask your health care provider. Keep all follow-up visits. This is important. Contact a health care provider if: Your pain, bruising, or swelling gets worse. Your skin becomes red, gets a rash, or has open sores. Your pain does not get better or it gets worse. Get help right away if: You have a new or sudden sharp pain in the hand, arm, or wrist. You have tingling or numbness in your hand. Your fingers turn white, very red, or cold and blue. You cannot move your fingers. Summary A wrist sprain is damage to ligaments in your wrist. Wrist sprains can range from mild to severe. Return to your normal activities as told by your health care provider. Ask your health care provider what activities are safe for you. You may need to wear a splint, brace, or cast for a short period of time. This information is not intended to replace advice given to you by your health care provider. Make sure you discuss any questions you have with your health care provider. Document Revised: 09/17/2019 Document Reviewed: 09/17/2019 Elsevier Patient Education  2021 [employer].

## 2020-08-22 NOTE — Assessment & Plan Note (Signed)
Patient had a fall in the last 24 hours breaking the fall with her right wrist and landed palms down.  Patient is experiencing moderate to severe pain mild tingling and numbness.  Patient does not want an x-ray today.  On assessment patient has limited range of motion and reports pain 5-7 out of 10.  Education provided to patient with printed handouts given.  Depo-Medrol shot given, advised patient to start ibuprofen, ice pack, wrist brace.  Return to work without lifting or twisting.  Follow-up with worsening unresolved symptoms.   Rx sent to pharmacy.

## 2020-09-03 ENCOUNTER — Encounter: Payer: Self-pay | Admitting: Nurse Practitioner

## 2020-09-03 ENCOUNTER — Ambulatory Visit: Payer: 59 | Admitting: Nurse Practitioner

## 2020-09-03 NOTE — Telephone Encounter (Signed)
Okay to right note?

## 2020-09-04 ENCOUNTER — Telehealth: Payer: Self-pay

## 2020-09-04 NOTE — Telephone Encounter (Signed)
Can you send me a doctors note before Friday saying I'm good to go back to work from the last visit patient seen Sheryl Baxter ok to right note?

## 2020-09-04 NOTE — Telephone Encounter (Signed)
Patient needs note stating she is okay to return to work tomorrow.  Note done.  Patient aware.

## 2020-09-04 NOTE — Telephone Encounter (Signed)
Ok to write note

## 2020-09-05 ENCOUNTER — Other Ambulatory Visit: Payer: Self-pay | Admitting: Family Medicine

## 2020-09-05 DIAGNOSIS — N926 Irregular menstruation, unspecified: Secondary | ICD-10-CM

## 2020-11-21 NOTE — Progress Notes (Signed)
Sheryl Baxter is a 25 y.o. female presents to office today for annual physical exam examination.    Concerns today include: 1.  Contraception Patient would like to switch from her OCPs to [place].  Her mother had a Nexplanon seem to do fairly well with this.  She does not report any issues with her menstrual cycles but admits that she has not the best at remembering to take the pill.  She is in a monogamous relationship.  Last menstrual cycle was at the end of June so she will be due for her next menses in the next week or so.  Occupation: now working as a Industrial/product designer for [employer], Marital status: married, Substance use: none Diet: fair, Exercise: stays active at work/ lifting Last eye exam: UTD Last dental exam: UTD Last colonoscopy: n/a Last mammogram: n/a Last pap smear: UTD Refills needed today: none Immunizations needed:  Immunization History  Administered Date(s) Administered   DTaP 08/22/1995, 10/21/1995, 12/19/1995, 10/22/1996, 11/13/1999   HPV Quadrivalent 04/19/2006, 06/29/2006, 10/27/2006   Hepatitis B 1995/08/07, 08/22/1995, 12/19/1995   HiB (PRP-OMP) 08/22/1995, 10/21/1995, 12/19/1995, 10/22/1996   Hpv-Unspecified 04/19/2006, 06/29/2006, 10/27/2006   IPV 08/22/1995, 10/21/1995, 03/22/1996, 11/11/1998   Influenza Inj Mdck Quad Pf 04/04/2019   Influenza,inj,Quad PF,6+ Mos 04/04/2019   Influenza-Unspecified 05/31/2020   MMR 10/22/1996, 11/13/1999   Meningococcal Conjugate 11/05/2009   Moderna Sars-Covid-2 Vaccination 05/31/2020   Pneumococcal Conjugate-13 07/22/1999   Td 11/15/2018   Tdap 10/27/2006   Varicella 06/05/1996     Past Medical History:  Diagnosis Date   UTI (urinary tract infection) 03/30/2017   Social History   Socioeconomic History   Marital status: Single    Spouse name: Not on file   Number of children: Not on file   Years of education: Not on file   Highest education level: Not on file  Occupational History   Not on file  Tobacco Use    Smoking status: Never   Smokeless tobacco: Never  Vaping Use   Vaping Use: Never used  Substance and Sexual Activity   Alcohol use: No   Drug use: No   Sexual activity: Not Currently  Other Topics Concern   Not on file  Social History Narrative   Not on file   Social Determinants of Health   Financial Resource Strain: Not on file  Food Insecurity: Not on file  Transportation Needs: Not on file  Physical Activity: Not on file  Stress: Not on file  Social Connections: Not on file  Intimate Partner Violence: Not on file   No past surgical history on file. Family History  Problem Relation Age of Onset   GER disease Mother    Heart murmur Mother    GER disease Father    Sleep apnea Father    Depression Maternal Grandmother    Bipolar disorder Maternal Grandmother    Osteoporosis Maternal Grandmother    GER disease Maternal Grandmother    Heart disease Maternal Grandfather    Cancer Paternal Grandmother        Esophagus cancer   Heart disease Paternal Grandfather     Current Outpatient Medications:    ibuprofen (ADVIL) 600 MG tablet, Take 1 tablet (600 mg total) by mouth every 8 (eight) hours as needed., Disp: 30 tablet, Rfl: 0   Norgestimate-Ethinyl Estradiol Triphasic 0.18/0.215/0.25 MG-35 MCG tablet, TAKE 1 TABLET DAILY, Disp: 84 tablet, Rfl: 0  Allergies  Allergen Reactions   Zyrtec [Cetirizine]     Dry eye  ROS: Review of Systems Pertinent items noted in HPI and remainder of comprehensive ROS otherwise negative.    Physical exam BP 112/67   Pulse 76   Temp 97.8 F (36.6 C)   Ht 5' 2"  (1.575 m)   Wt 150 lb 12.8 oz (68.4 kg)   LMP 11/16/2020   SpO2 96%   BMI 27.58 kg/m  General appearance: alert, cooperative, appears stated age, and no distress Head: Normocephalic, without obvious abnormality, atraumatic Eyes: negative findings: lids and lashes normal, conjunctivae and sclerae normal, corneas clear, and pupils equal, round, reactive to light and  accomodation Ears: normal TM's and external ear canals both ears Nose: Nares normal. Septum midline. Mucosa normal. No drainage or sinus tenderness. Throat: lips, mucosa, and tongue normal; teeth and gums normal Neck: no adenopathy, supple, symmetrical, trachea midline, and thyroid not enlarged, symmetric, no tenderness/mass/nodules Back: symmetric, no curvature. ROM normal. No CVA tenderness. Lungs: clear to auscultation bilaterally Heart: regular rate and rhythm, S1, S2 normal, no murmur, click, rub or gallop Abdomen: soft, non-tender; bowel sounds normal; no masses,  no organomegaly Extremities: extremities normal, atraumatic, no cyanosis or edema Pulses: 2+ and symmetric Skin: Skin color, texture, turgor normal. No rashes or lesions Lymph nodes: Cervical, supraclavicular, and axillary nodes normal. Neurologic: Alert and oriented X 3, normal strength and tone. Normal symmetric reflexes. Normal coordination and gait    Assessment/ Plan: Sheryl Baxter here for annual physical exam.   Well woman exam with routine gynecological exam  Encounter for general counseling on prescription of oral contraceptives - Plan: CANCELED: CMP14+EGFR  We will plan to insert Nexplanon in August.  This has been ordered for the patient.  Asked that she make sure not to void prior to the appointment so that we may obtain a urine pregnancy.  She voiced good understanding.  For now she will continue OCPs.  Handout on healthy lifestyle choices, including diet (rich in fruits, vegetables and lean meats and low in salt and simple carbohydrates) and exercise (at least 30 minutes of moderate physical activity daily).  Patient to follow up in 1 year for annual exam or sooner if needed.  Birgit Nowling M. Lajuana Ripple, DO

## 2020-11-28 ENCOUNTER — Encounter: Payer: Self-pay | Admitting: Family Medicine

## 2020-11-28 ENCOUNTER — Other Ambulatory Visit: Payer: Self-pay | Admitting: Family Medicine

## 2020-11-28 ENCOUNTER — Telehealth: Payer: Self-pay | Admitting: Family Medicine

## 2020-11-28 DIAGNOSIS — N926 Irregular menstruation, unspecified: Secondary | ICD-10-CM

## 2020-11-28 MED ORDER — NORGESTIM-ETH ESTRAD TRIPHASIC 0.18/0.215/0.25 MG-35 MCG PO TABS: 1.0000 | ORAL_TABLET | Freq: Every day | ORAL | 0 refills | Status: AC

## 2020-11-28 NOTE — Telephone Encounter (Signed)
Patient aware to call pharmacy and tell her she does not want it. And she will discuss at appointment with Dr. Darnell Level to change

## 2020-12-01 ENCOUNTER — Other Ambulatory Visit: Payer: Self-pay

## 2020-12-01 ENCOUNTER — Ambulatory Visit (INDEPENDENT_AMBULATORY_CARE_PROVIDER_SITE_OTHER): Payer: 59 | Admitting: Family Medicine

## 2020-12-01 ENCOUNTER — Encounter: Payer: Self-pay | Admitting: Family Medicine

## 2020-12-01 VITALS — BP 112/67 | HR 76 | Temp 97.8°F | Ht 62.0 in | Wt 150.8 lb

## 2020-12-01 DIAGNOSIS — Z01419 Encounter for gynecological examination (general) (routine) without abnormal findings: Secondary | ICD-10-CM

## 2020-12-01 DIAGNOSIS — Z3009 Encounter for other general counseling and advice on contraception: Secondary | ICD-10-CM

## 2020-12-01 NOTE — Patient Instructions (Addendum)
We're ordering the Nexplanon for you.  Please make sure to hold your urine that morning so we can do a pregnancy test.  Keep using the birth control pill until then.  We can get you in on August 1 at 4pm for the insertion.  You WILL have to refrain from heavy lifting for 24-48 hours after insertion so that the wound can heal.  Preventive Care 50-25 Years Old, Female Preventive care refers to lifestyle choices and visits with your health care provider that can promote health and wellness. This includes: A yearly physical exam. This is also called an annual wellness visit. Regular dental and eye exams. Immunizations. Screening for certain conditions. Healthy lifestyle choices, such as: Eating a healthy diet. Getting regular exercise. Not using drugs or products that contain nicotine and tobacco. Limiting alcohol use. What can I expect for my preventive care visit? Physical exam Your health care provider may check your: Height and weight. These may be used to calculate your BMI (body mass index). BMI is a measurement that tells if you are at a healthy weight. Heart rate and blood pressure. Body temperature. Skin for abnormal spots. Counseling Your health care provider may ask you questions about your: Past medical problems. Family's medical history. Alcohol, tobacco, and drug use. Emotional well-being. Home life and relationship well-being. Sexual activity. Diet, exercise, and sleep habits. Work and work Statistician. Access to firearms. Method of birth control. Menstrual cycle. Pregnancy history. What immunizations do I need?  Vaccines are usually given at various ages, according to a schedule. Your health care provider will recommend vaccines for you based on your age, medicalhistory, and lifestyle or other factors, such as travel or where you work. What tests do I need?  Blood tests Lipid and cholesterol levels. These may be checked every 5 years starting at age  67. Hepatitis C test. Hepatitis B test. Screening Diabetes screening. This is done by checking your blood sugar (glucose) after you have not eaten for a while (fasting). STD (sexually transmitted disease) testing, if you are at risk. BRCA-related cancer screening. This may be done if you have a family history of breast, ovarian, tubal, or peritoneal cancers. Pelvic exam and Pap test. This may be done every 3 years starting at age 3. Starting at age 51, this may be done every 5 years if you have a Pap test in combination with an HPV test. Talk with your health care provider about your test results, treatment options,and if necessary, the need for more tests. Follow these instructions at home: Eating and drinking  Eat a healthy diet that includes fresh fruits and vegetables, whole grains, lean protein, and low-fat dairy products. Take vitamin and mineral supplements as recommended by your health care provider. Do not drink alcohol if: Your health care provider tells you not to drink. You are pregnant, may be pregnant, or are planning to become pregnant. If you drink alcohol: Limit how much you have to 0-1 drink a day. Be aware of how much alcohol is in your drink. In the [place] one drink equals one 12 oz bottle of beer (355 mL), one 5 oz glass of wine (148 mL), or one 1 oz glass of hard liquor (44 mL).  Lifestyle Take daily care of your teeth and gums. Brush your teeth every morning and night with fluoride toothpaste. Floss one time each day. Stay active. Exercise for at least 30 minutes 5 or more days each week. Do not use any products that contain nicotine or tobacco,  such as cigarettes, e-cigarettes, and chewing tobacco. If you need help quitting, ask your health care provider. Do not use drugs. If you are sexually active, practice safe sex. Use a condom or other form of protection to prevent STIs (sexually transmitted infections). If you do not wish to become pregnant, use a form of  birth control. If you plan to become pregnant, see your health care provider for a prepregnancy visit. Find healthy ways to cope with stress, such as: Meditation, yoga, or listening to music. Journaling. Talking to a trusted person. Spending time with friends and family. Safety Always wear your seat belt while driving or riding in a vehicle. Do not drive: If you have been drinking alcohol. Do not ride with someone who has been drinking. When you are tired or distracted. While texting. Wear a helmet and other protective equipment during sports activities. If you have firearms in your house, make sure you follow all gun safety procedures. Seek help if you have been physically or sexually abused. What's next? Go to your health care provider once a year for an annual wellness visit. Ask your health care provider how often you should have your eyes and teeth checked. Stay up to date on all vaccines. This information is not intended to replace advice given to you by your health care provider. Make sure you discuss any questions you have with your healthcare provider. Document Revised: 01/06/2020 Document Reviewed: 01/19/2018 Elsevier Patient Education  2022 [employer].

## 2020-12-10 ENCOUNTER — Encounter: Payer: Self-pay | Admitting: Family Medicine

## 2020-12-10 ENCOUNTER — Ambulatory Visit (INDEPENDENT_AMBULATORY_CARE_PROVIDER_SITE_OTHER): Payer: 59 | Admitting: Family Medicine

## 2020-12-10 DIAGNOSIS — K529 Noninfective gastroenteritis and colitis, unspecified: Secondary | ICD-10-CM

## 2020-12-10 MED ORDER — METRONIDAZOLE 500 MG PO TABS: 500.0000 mg | ORAL_TABLET | Freq: Three times a day (TID) | ORAL | 0 refills | Status: DC

## 2020-12-10 MED ORDER — DIPHENOXYLATE-ATROPINE 2.5-0.025 MG PO TABS: 2.0000 | ORAL_TABLET | Freq: Four times a day (QID) | ORAL | 0 refills | Status: AC | PRN

## 2020-12-10 MED ORDER — ONDANSETRON 8 MG PO TBDP: 8.0000 mg | ORAL_TABLET | Freq: Four times a day (QID) | ORAL | 1 refills | Status: AC | PRN

## 2020-12-10 NOTE — Progress Notes (Signed)
Subjective:    Patient ID: Sheryl Baxter, female    DOB: February 01, 1996, 25 y.o.   MRN: [id number]   HPI: Sheryl Baxter is a 25 y.o. female presenting for onset 2 days ago with HA. Nausea also. Got much worse in spite of laying down. Temp went to 103. So far today is 99. Not holding down foods. Vomiting yesterday. Stomach is sore. Hx of colitis. Having diarrhea now. Frequent but small amount.Sides and lower abd feel like pressure. Holding down water and gatorade. Blood in stool, just a little starting today. No tenesmus.     Depression screen [facility] 2/9 12/01/2020 08/22/2020 11/30/2019 11/15/2018 06/17/2018  Decreased Interest 0 0 0 0 0  Down, Depressed, Hopeless 0 0 0 0 0  PHQ - 2 Score 0 0 0 0 0  Altered sleeping - 0 - 0 0  Tired, decreased energy - 0 - 0 0  Change in appetite - 0 - 0 0  Feeling bad or failure about yourself  - 0 - 0 0  Trouble concentrating - 0 - 0 0  Moving slowly or fidgety/restless - 0 - 0 0  Suicidal thoughts - 0 - 0 0  PHQ-9 Score - 0 - 0 0     Relevant past medical, surgical, family and social history reviewed and updated as indicated.  Interim medical history since our last visit reviewed. Allergies and medications reviewed and updated.  ROS:  Review of Systems  Constitutional:  Positive for activity change, appetite change and fever.  HENT: Negative.    Respiratory:  Negative for cough and shortness of breath.   Gastrointestinal:  Positive for abdominal distention, abdominal pain, blood in stool, diarrhea, nausea and vomiting.    Social History   Tobacco Use  Smoking Status Never  Smokeless Tobacco Never       Objective:     Wt Readings from Last 3 Encounters:  12/01/20 150 lb 12.8 oz (68.4 kg)  08/22/20 148 lb (67.1 kg)  11/30/19 149 lb 3.2 oz (67.7 kg)     Exam deferred. Pt. Harboring due to COVID 19. Phone visit performed.   Assessment & Plan:   1. Gastroenteritis, acute   2. Colitis     Meds ordered this encounter  Medications    metroNIDAZOLE (FLAGYL) 500 MG tablet    Sig: Take 1 tablet (500 mg total) by mouth 3 (three) times daily.    Dispense:  21 tablet    Refill:  0   ondansetron (ZOFRAN-ODT) 8 MG disintegrating tablet    Sig: Take 1 tablet (8 mg total) by mouth every 6 (six) hours as needed for nausea or vomiting.    Dispense:  20 tablet    Refill:  1   diphenoxylate-atropine (LOMOTIL) 2.5-0.025 MG tablet    Sig: Take 2 tablets by mouth 4 (four) times daily as needed for diarrhea or loose stools.    Dispense:  30 tablet    Refill:  0    No orders of the defined types were placed in this encounter.     Diagnoses and all orders for this visit:  Gastroenteritis, acute  Colitis  Other orders -     metroNIDAZOLE (FLAGYL) 500 MG tablet; Take 1 tablet (500 mg total) by mouth 3 (three) times daily. -     ondansetron (ZOFRAN-ODT) 8 MG disintegrating tablet; Take 1 tablet (8 mg total) by mouth every 6 (six) hours as needed for nausea or vomiting. -     diphenoxylate-atropine (LOMOTIL) 2.5-0.025  MG tablet; Take 2 tablets by mouth 4 (four) times daily as needed for diarrhea or loose stools.   Virtual Visit via telephone Note  I discussed the limitations, risks, security and privacy concerns of performing an evaluation and management service by telephone and the availability of in person appointments. The patient was identified with two identifiers. Pt.expressed understanding and agreed to proceed. Pt. Is at home. Dr. Livia Snellen is in his office.  Follow Up Instructions:   I discussed the assessment and treatment plan with the patient. The patient was provided an opportunity to ask questions and all were answered. The patient agreed with the plan and demonstrated an understanding of the instructions.   The patient was advised to call back or seek an in-person evaluation if the symptoms worsen or if the condition fails to improve as anticipated.   Total minutes including chart review and phone contact time:  14   Follow up plan: Return if symptoms worsen or fail to improve.  Claretta Fraise, MD [place]

## 2020-12-22 ENCOUNTER — Encounter: Payer: Self-pay | Admitting: Family Medicine

## 2020-12-22 ENCOUNTER — Ambulatory Visit (INDEPENDENT_AMBULATORY_CARE_PROVIDER_SITE_OTHER): Payer: 59 | Admitting: Family Medicine

## 2020-12-22 ENCOUNTER — Other Ambulatory Visit: Payer: Self-pay

## 2020-12-22 VITALS — BP 115/77 | HR 77 | Temp 97.6°F | Ht 62.0 in | Wt 146.6 lb

## 2020-12-22 DIAGNOSIS — Z30017 Encounter for initial prescription of implantable subdermal contraceptive: Secondary | ICD-10-CM | POA: Diagnosis not present

## 2020-12-22 LAB — PREGNANCY, URINE: Preg Test, Ur: NEGATIVE

## 2020-12-22 MED ORDER — ETONOGESTREL 68 MG ~~LOC~~ IMPL
68.0000 mg | DRUG_IMPLANT | Freq: Once | SUBCUTANEOUS | Status: AC
  Administered 2020-12-22: 68 mg via SUBCUTANEOUS

## 2020-12-22 NOTE — Patient Instructions (Signed)
[facility] INSTRUCTIONS  You had Nexplanon placed today. This device is over 99% effective in preventing pregnancy and will be good for 3 years.  If you are sexually active, please use a back up method of birth control (condoms, spermicide, etc) for the next 7 days or abstain from intercourse for the next 7 days.   You may notice a small bruise where the device was placed. You may experience mild discomfort for the next 24-48 hours. This is normal. You may have small amounts of bleeding at the insertion site, though bleeding should stop within the next several hours.  You may remove your arm wrap in 24 hours.   You may start showering after 24 hours.  DO NOT take a tub bath, swim, or submerge your body in water for the next 5 days. Leave the Steri-Strips stitches (stickers used to keep your wound closed) on for the next 3-5 days. These should fall off by themselves but if they do not, you may remove them. It is okay to occasionally touch your device to make sure it is still in place but please DO NOT attempt to bend or move it under your skin.   Please contact your doctor immediately if: You develop a fever You have worsening pain or swelling You have redness that goes up and down your arm You have pus draining from your insertion site You are bleeding large amounts   Etonogestrel implant What is this medicine? ETONOGESTREL (et oh noe JES trel) is a contraceptive (birth control) device. It is used to prevent pregnancy. It can be used for up to 3 years. This medicine may be used for other purposes; ask your health care provider or pharmacist if you have questions. COMMON BRAND NAME(S): Implanon, Nexplanon What should I tell my health care provider before I take this medicine? They need to know if you have any of these conditions: -abnormal vaginal bleeding -blood vessel disease or blood clots -cancer of the breast, cervix, or liver -depression -diabetes -gallbladder  disease -headaches -heart disease or recent heart attack -high blood pressure -high cholesterol -kidney disease -liver disease -renal disease -seizures -tobacco smoker -an unusual or allergic reaction to etonogestrel, other hormones, anesthetics or antiseptics, medicines, foods, dyes, or preservatives -pregnant or trying to get pregnant -breast-feeding How should I use this medicine? This device is inserted just under the skin on the inner side of your upper arm by a health care professional. Talk to your pediatrician regarding the use of this medicine in children. Special care may be needed. Overdosage: If you think you have taken too much of this medicine contact a poison control center or emergency room at once. NOTE: This medicine is only for you. Do not share this medicine with others. What if I miss a dose? This does not apply. What may interact with this medicine? Do not take this medicine with any of the following medications: -amprenavir -bosentan -fosamprenavir This medicine may also interact with the following medications: -barbiturate medicines for inducing sleep or treating seizures -certain medicines for fungal infections like ketoconazole and itraconazole -grapefruit juice -griseofulvin -medicines to treat seizures like carbamazepine, felbamate, oxcarbazepine, phenytoin, topiramate -modafinil -phenylbutazone -rifampin -rufinamide -some medicines to treat HIV infection like atazanavir, indinavir, lopinavir, nelfinavir, tipranavir, ritonavir -St. John's wort This list may not describe all possible interactions. Give your health care provider a list of all the medicines, herbs, non-prescription drugs, or dietary supplements you use. Also tell them if you smoke, drink alcohol, or use  illegal drugs. Some items may interact with your medicine. What should I watch for while using this medicine? This product does not protect you against HIV infection (AIDS) or other  sexually transmitted diseases. You should be able to feel the implant by pressing your fingertips over the skin where it was inserted. Contact your doctor if you cannot feel the implant, and use a non-hormonal birth control method (such as condoms) until your doctor confirms that the implant is in place. If you feel that the implant may have broken or become bent while in your arm, contact your healthcare provider. What side effects may I notice from receiving this medicine? Side effects that you should report to your doctor or health care professional as soon as possible: -allergic reactions like skin rash, itching or hives, swelling of the face, lips, or tongue -breast lumps -changes in emotions or moods -depressed mood -heavy or prolonged menstrual bleeding -pain, irritation, swelling, or bruising at the insertion site -scar at site of insertion -signs of infection at the insertion site such as fever, and skin redness, pain or discharge -signs of pregnancy -signs and symptoms of a blood clot such as breathing problems; changes in vision; chest pain; severe, sudden headache; pain, swelling, warmth in the leg; trouble speaking; sudden numbness or weakness of the face, arm or leg -signs and symptoms of liver injury like dark yellow or brown urine; general ill feeling or flu-like symptoms; light-colored stools; loss of appetite; nausea; right upper belly pain; unusually weak or tired; yellowing of the eyes or skin -unusual vaginal bleeding, discharge -signs and symptoms of a stroke like changes in vision; confusion; trouble speaking or understanding; severe headaches; sudden numbness or weakness of the face, arm or leg; trouble walking; dizziness; loss of balance or coordination Side effects that usually do not require medical attention (report to your doctor or health care professional if they continue or are bothersome): -acne -back pain -breast pain -changes in weight -dizziness -general ill  feeling or flu-like symptoms -headache -irregular menstrual bleeding -nausea -sore throat -vaginal irritation or inflammation This list may not describe all possible side effects. Call your doctor for medical advice about side effects. You may report side effects to FDA at 1-800-FDA-1088. Where should I keep my medicine? This drug is given in a hospital or clinic and will not be stored at home. NOTE: This sheet is a summary. It may not cover all possible information. If you have questions about this medicine, talk to your doctor, pharmacist, or health care provider.  2018 Elsevier/Gold Standard (2015-11-27 11:19:22)

## 2020-12-22 NOTE — Progress Notes (Signed)
Subjective: CC: Contraceptive counseling HPI: Sheryl Baxter is a 25 y.o. female presenting to clinic today for:  1. Contraception counseling: Patient presents today for discussion of contraception.  She is a No obstetric history on file. . Previous methods of birth control tried: OCP.  Patient's last menstrual period was 12/22/2020 (exact date).  Last Pap smear: 11/30/19, negative  Family History  Problem Relation Age of Onset   GER disease Mother    Heart murmur Mother    GER disease Father    Sleep apnea Father    Depression Maternal Grandmother    Bipolar disorder Maternal Grandmother    Osteoporosis Maternal Grandmother    GER disease Maternal Grandmother    Heart disease Maternal Grandfather    Cancer Paternal Grandmother        Esophagus cancer   Heart disease Paternal Grandfather    Past Medical History:  Diagnosis Date   UTI (urinary tract infection) 03/30/2017   Allergies  Allergen Reactions   Zyrtec [Cetirizine]     Dry eye   Medications reviewed.   ROS: Per HPI  Objective: Office vital signs reviewed. BP 115/77   Pulse 77   Temp 97.6 F (36.4 C)   Ht 5' 2"  (1.575 m)   Wt 146 lb 9.6 oz (66.5 kg)   SpO2 98%   BMI 26.81 kg/m   Physical Examination:  General: Awake, alert, well nourished, No acute distress Cardio: regular rate  Pulm: normal WOB on room air Extremities: warm, well perfused, No cyanosis or clubbing; +2 radial pulses bilaterally Skin: dry; intact; no rashes or lesions  No results found for this or any previous visit (from the past 24 hour(s)). NEGATIVE Urine pregnancy  Nexplanon Insertion  The patient denies any allergies to anesthetics or antiseptics.  The risks & benefits of Nexplanon insertion/use were discussed with the patient, who has elected to proceed with placement. Packaging instructions/ card supplied to patient.  A signed consent was obtained and was placed in patient's medical chart.  Procedure: Patient was placed  in supine position. The left arm was flexed at the elbow and externally rotated so that her wrist was parallel to her ear. The medial epicondyle of the left arm was identified. The insertion site was marked 8 cm proximal to the medial epicondyle. The insertion site was cleaned with Betadine. The area surrounding the insertion site was covered with a sterile drape. 1% lidocaine with epinephrine was injected just under the skin at the insertion site extending 4 cm proximally. The sterile preloaded disposable Nexaplanon applicator was removed from the sterile packaging and the applicator needle was inserted at a 30 degree angle at 8 cm proximal to the medial epicondyle as marked. The applicator was lowered to a horizontal position and advanced just under the skin for the full length of the needle. The slider of the applicator was retracted fully while the applicator remained in the same position.  The applicator was then removed.  Hemostasis was confirmed.  Position of the Nexplanon device was confirmed by palpation and was demonstrated to the patient.  Area was cleaned with rubbing alcohol.  A steri strip was applied to the incision site and site was wrapped in a pressure bandage.    There were no immediate complications.  Assessment/ Plan: 25 y.o. female   Encounter for initial prescription of Nexplanon - Plan: Pregnancy, urine  Nexplanon insertion  Insertion was successful.  No immediate complications.  She tolerated the procedure without difficulty.  She was monitored  for 5 minutes in the supine position and then again for 10 minutes in the sitting position before being allowed to leave.  The patient was instructed to removed the pressure dressing in 24 hrs.  Home care instructions were reviewed with patient.  Return precautions (including signs and symptoms of infection) were reviewed with patient.  The patient voiced good understanding.  All questions answered.  The patient was instructed to  follow-up in 1 month.  Janora Norlander, DO [facility] [phone]

## 2021-12-02 ENCOUNTER — Encounter: Payer: 59 | Admitting: Family Medicine

## 2022-03-09 ENCOUNTER — Telehealth: Payer: Self-pay

## 2022-03-09 NOTE — Telephone Encounter (Signed)
Called to do [facility] call and per review on chart patient has changed PCP offices
